# Patient Record
Sex: Female | Born: 1995 | Race: Black or African American | Hispanic: No | Marital: Single | Smoking: Never smoker
Health system: Southern US, Community
[De-identification: ages and names within clinical notes are randomized; demographics above are authoritative.]

## PROBLEM LIST (undated history)

## (undated) DIAGNOSIS — F32A Depression, unspecified: Secondary | ICD-10-CM

## (undated) DIAGNOSIS — T7840XA Allergy, unspecified, initial encounter: Secondary | ICD-10-CM

## (undated) DIAGNOSIS — A5901 Trichomonal vulvovaginitis: Secondary | ICD-10-CM

## (undated) DIAGNOSIS — I10 Essential (primary) hypertension: Secondary | ICD-10-CM

## (undated) DIAGNOSIS — J45909 Unspecified asthma, uncomplicated: Secondary | ICD-10-CM

## (undated) DIAGNOSIS — K219 Gastro-esophageal reflux disease without esophagitis: Secondary | ICD-10-CM

## (undated) DIAGNOSIS — F909 Attention-deficit hyperactivity disorder, unspecified type: Secondary | ICD-10-CM

## (undated) DIAGNOSIS — A749 Chlamydial infection, unspecified: Secondary | ICD-10-CM

## (undated) HISTORY — DX: Unspecified asthma, uncomplicated: J45.909

## (undated) HISTORY — DX: Attention-deficit hyperactivity disorder, unspecified type: F90.9

## (undated) HISTORY — DX: Essential (primary) hypertension: I10

## (undated) HISTORY — DX: Trichomonal vulvovaginitis: A59.01

## (undated) HISTORY — DX: Allergy, unspecified, initial encounter: T78.40XA

## (undated) HISTORY — DX: Depression, unspecified: F32.A

## (undated) HISTORY — DX: Chlamydial infection, unspecified: A74.9

---
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
## 2016-08-31 ENCOUNTER — Emergency Department (HOSPITAL_COMMUNITY)
Admission: EM | Admit: 2016-08-31 | Discharge: 2016-08-31 | Disposition: A | Discharge status: Home / Self Care | Payer: Medicaid Other | Attending: Emergency Medicine | Admitting: Emergency Medicine

## 2016-08-31 ENCOUNTER — Encounter (HOSPITAL_COMMUNITY): Payer: Self-pay | Admitting: *Deleted

## 2016-08-31 ENCOUNTER — Emergency Department (HOSPITAL_COMMUNITY): Payer: Medicaid Other

## 2016-08-31 DIAGNOSIS — M545 Low back pain: Secondary | ICD-10-CM

## 2016-08-31 DIAGNOSIS — R112 Nausea with vomiting, unspecified: Secondary | ICD-10-CM

## 2016-08-31 DIAGNOSIS — F909 Attention-deficit hyperactivity disorder, unspecified type: Secondary | ICD-10-CM | POA: Diagnosis not present

## 2016-08-31 DIAGNOSIS — R109 Unspecified abdominal pain: Secondary | ICD-10-CM | POA: Diagnosis not present

## 2016-08-31 LAB — I-STAT BETA HCG BLOOD, ED (MC, WL, AP ONLY): I-stat hCG, quantitative: <5 m[IU]/mL (ref <5)

## 2016-08-31 LAB — URINE MICROSCOPIC-ADD ON

## 2016-08-31 LAB — COMPREHENSIVE METABOLIC PANEL
ALT: 23 U/L (ref 14–54)
ANION GAP: 6 (ref 5–15)
AST: 37 U/L (ref 15–41)
Albumin: 3.4 g/dL — ABNORMAL LOW (ref 3.5–5.0)
Alkaline Phosphatase: 100 U/L (ref 38–126)
BILIRUBIN TOTAL: 0.2 mg/dL — AB (ref 0.3–1.2)
BUN: 8 mg/dL (ref 6–20)
CHLORIDE: 107 mmol/L (ref 101–111)
CO2: 23 mmol/L (ref 22–32)
Calcium: 8.7 mg/dL — ABNORMAL LOW (ref 8.9–10.3)
Creatinine, Ser: 0.6 mg/dL (ref 0.44–1.00)
Glucose, Bld: 91 mg/dL (ref 65–99)
POTASSIUM: 4.7 mmol/L (ref 3.5–5.1)
Sodium: 136 mmol/L (ref 135–145)
TOTAL PROTEIN: 7.3 g/dL (ref 6.5–8.1)

## 2016-08-31 LAB — CBC
HEMATOCRIT: 35.7 % — AB (ref 36.0–46.0)
HEMOGLOBIN: 10.8 g/dL — AB (ref 12.0–15.0)
MCH: 22.5 pg — ABNORMAL LOW (ref 26.0–34.0)
MCHC: 30.3 g/dL (ref 30.0–36.0)
MCV: 74.2 fL — AB (ref 78.0–100.0)
Platelets: 407 10*3/uL — ABNORMAL HIGH (ref 150–400)
RBC: 4.81 MIL/uL (ref 3.87–5.11)
RDW: 16.1 % — AB (ref 11.5–15.5)
WBC: 9.3 10*3/uL (ref 4.0–10.5)

## 2016-08-31 LAB — URINALYSIS, ROUTINE W REFLEX MICROSCOPIC
Bilirubin Urine: NEGATIVE
GLUCOSE, UA: NEGATIVE mg/dL
KETONES UR: NEGATIVE mg/dL
NITRITE: NEGATIVE
PH: 7 (ref 5.0–8.0)
Protein, ur: NEGATIVE mg/dL
Specific Gravity, Urine: 1.02 (ref 1.005–1.030)

## 2016-08-31 LAB — LIPASE, BLOOD: LIPASE: 18 U/L (ref 11–51)

## 2016-08-31 MED ORDER — PROMETHAZINE HCL 25 MG/ML IJ SOLN
12.5000 mg | Freq: Once | INTRAMUSCULAR | Status: AC
  Administered 2016-08-31: 12.5 mg via INTRAVENOUS
  Filled 2016-08-31: qty 1

## 2016-08-31 MED ORDER — CEPHALEXIN 500 MG PO CAPS: ORAL_CAPSULE | ORAL | 0 refills | Status: DC

## 2016-08-31 MED ORDER — PROMETHAZINE HCL 25 MG PO TABS: 25.0000 mg | ORAL_TABLET | Freq: Four times a day (QID) | ORAL | 0 refills | Status: DC | PRN

## 2016-08-31 MED ORDER — KETOROLAC TROMETHAMINE 30 MG/ML IJ SOLN
30.0000 mg | Freq: Once | INTRAMUSCULAR | Status: AC
  Administered 2016-08-31: 30 mg via INTRAVENOUS
  Filled 2016-08-31: qty 1

## 2016-08-31 MED ORDER — TRAMADOL HCL 50 MG PO TABS: 50.0000 mg | ORAL_TABLET | Freq: Four times a day (QID) | ORAL | 0 refills | Status: DC | PRN

## 2016-08-31 MED ORDER — ONDANSETRON HCL 4 MG/2ML IJ SOLN
4.0000 mg | Freq: Once | INTRAMUSCULAR | Status: AC
  Administered 2016-08-31: 4 mg via INTRAVENOUS
  Filled 2016-08-31: qty 2

## 2016-08-31 MED ORDER — HYDROMORPHONE HCL 2 MG/ML IJ SOLN
1.0000 mg | Freq: Once | INTRAMUSCULAR | Status: AC
  Administered 2016-08-31: 1 mg via INTRAVENOUS
  Filled 2016-08-31: qty 1

## 2016-08-31 MED ORDER — IOPAMIDOL (ISOVUE-300) INJECTION 61%
INTRAVENOUS | Status: AC
  Administered 2016-08-31: 100 mL
  Filled 2016-08-31: qty 100

## 2016-08-31 MED ORDER — SODIUM CHLORIDE 0.9 % IV SOLN
1000.0000 mL | Freq: Once | INTRAVENOUS | Status: AC
  Administered 2016-08-31: 1000 mL via INTRAVENOUS

## 2016-08-31 MED ORDER — SODIUM CHLORIDE 0.9 % IV SOLN
1000.0000 mL | INTRAVENOUS | Status: DC
  Administered 2016-08-31 (×3): 1000 mL via INTRAVENOUS

## 2016-08-31 MED ORDER — HYDROCODONE-ACETAMINOPHEN 5-325 MG PO TABS: 1.0000 | ORAL_TABLET | ORAL | 0 refills | Status: DC | PRN

## 2016-08-31 MED ORDER — CEFTRIAXONE SODIUM 1 G IJ SOLR
1.0000 g | Freq: Once | INTRAMUSCULAR | Status: AC
  Administered 2016-08-31: 1 g via INTRAVENOUS
  Filled 2016-08-31: qty 10

## 2016-08-31 MED ORDER — SODIUM CHLORIDE 0.9 % IV BOLUS (SEPSIS)
1000.0000 mL | Freq: Once | INTRAVENOUS | Status: AC
  Administered 2016-08-31: 1000 mL via INTRAVENOUS

## 2016-08-31 NOTE — ED Provider Notes (Signed)
[facility]-EMERGENCY DEPT Provider Note   CSN: [id number] Arrival date & time: 08/31/16  1101     History   Chief Complaint Chief Complaint  Patient presents with  . Emesis  . Back Pain    HPI Brandy [name] is a 20 y.o. female   Patient is a 20 year old female with history of cholecystectomy presented with worsening bilateral flank pain 1 week. She reports 3 episodes of vomiting within the last week and loss of appetite. Eating anything today and trying the yesterday with little success. She characterizes her flank pain as pressure with right greater than left and 8/10. She admits to having chills. She denies urinary symptoms, chest pain, shortness of breath, hematuria, hematemesis, bloody stools, or any recent sexual activity.    The history is provided by the patient.  Emesis   Associated symptoms include chills, cough, diarrhea and headaches. Pertinent negatives include no arthralgias and no fever.  Back Pain   Associated symptoms include headaches. Pertinent negatives include no chest pain, no fever and no dysuria.    Past Medical History:  Diagnosis Date  . Abdominal pain, recurrent   . ADHD (attention deficit hyperactivity disorder)   . Constipation   . GERD (gastroesophageal reflux disease)   . Nausea     Patient Active Problem List   Diagnosis Date Noted  . Cholecystitis with cholelithiasis 11/16/2015  . Constipation   . Nausea   . Abdominal pain, recurrent     Past Surgical History:  Procedure Laterality Date  . CHOLECYSTECTOMY N/A 11/17/2015   Procedure: LAPAROSCOPIC CHOLECYSTECTOMY ATTEMPTED INTRAOPERATIVE CHOLANGIOGRAM ;  Surgeon: Harriette Bouillon, MD;  Location: [facility] OR;  Service: General;  Laterality: N/A;  . NO PAST SURGERIES      OB History    Gravida Para Term Preterm AB Living   0             SAB TAB Ectopic Multiple Live Births                   Home Medications    Prior to Admission medications   Medication Sig Start Date End Date Taking?  Authorizing Provider  acetaminophen (TYLENOL) 325 MG tablet Take 650 mg by mouth every 6 (six) hours as needed for moderate pain or headache.   Yes Historical Provider, MD  ibuprofen (ADVIL,MOTRIN) 200 MG tablet Take 400 mg by mouth every 6 (six) hours as needed for headache or moderate pain.   Yes Historical Provider, MD  HYDROcodone-acetaminophen (NORCO/VICODIN) 5-325 MG tablet Take 1-2 tablets by mouth every 4 (four) hours as needed for moderate pain. Patient not taking: Reported on 08/31/2016 11/18/15   Claud Kelp, MD  norgestimate-ethinyl estradiol (ORTHO-CYCLEN, 28,) 0.25-35 MG-MCG tablet Take 1 tablet by mouth daily. Patient not taking: Reported on 08/31/2016 11/01/13   Delbert Phenix, NP    Family History History reviewed. No pertinent family history.  Social History Social History  Substance Use Topics  . Smoking status: Never Smoker  . Smokeless tobacco: Never Used  . Alcohol use No     Allergies   Patient has no known allergies.   Review of Systems Review of Systems  Constitutional: Positive for appetite change and chills. Negative for fever.  HENT: Negative for ear pain and sore throat.   Eyes: Negative for pain and visual disturbance.  Respiratory: Positive for cough. Negative for shortness of breath.   Cardiovascular: Negative for chest pain and palpitations.  Gastrointestinal: Positive for diarrhea and vomiting. Negative for blood in  stool.  Genitourinary: Positive for flank pain (Bilaterally). Negative for difficulty urinating, dysuria and hematuria.  Musculoskeletal: Negative for arthralgias and back pain.  Skin: Negative for color change and rash.  Neurological: Positive for headaches. Negative for seizures and syncope.     Physical Exam Updated Vital Signs BP 128/74   Pulse 116   Temp 100 F (37.8 C) (Oral)   Resp 20   LMP 08/16/2016   SpO2 100%   Physical Exam  Constitutional: She is oriented to person, place, and time. She appears  well-developed and well-nourished.  HENT:  Head: Normocephalic and atraumatic.  Nose: Nose normal.  Mouth/Throat: Oropharynx is clear and moist.  Eyes: Conjunctivae and EOM are normal. Pupils are equal, round, and reactive to light.  Neck: Normal range of motion. Neck supple.  Cardiovascular: Normal rate and normal heart sounds.   Pulmonary/Chest: Effort normal and breath sounds normal. No respiratory distress.  Abdominal: Soft. There is tenderness. There is CVA tenderness (Bilateral). There is no rebound, no guarding and negative Murphy's sign.  Genitourinary: No vaginal discharge found.  Musculoskeletal: Normal range of motion.  Neurological: She is alert and oriented to person, place, and time.  Skin: Skin is warm.  Psychiatric: She has a normal mood and affect. Her behavior is normal.  Nursing note and vitals reviewed.    ED Treatments / Results  Labs (all labs ordered are listed, but only abnormal results are displayed) Labs Reviewed  COMPREHENSIVE METABOLIC PANEL - Abnormal; Notable for the following:       Result Value   Calcium 8.7 (*)    Albumin 3.4 (*)    Total Bilirubin 0.2 (*)    All other components within normal limits  CBC - Abnormal; Notable for the following:    Hemoglobin 10.8 (*)    HCT 35.7 (*)    MCV 74.2 (*)    MCH 22.5 (*)    RDW 16.1 (*)    Platelets 407 (*)    All other components within normal limits  URINALYSIS, ROUTINE W REFLEX MICROSCOPIC (NOT AT [facility]) - Abnormal; Notable for the following:    Hgb urine dipstick MODERATE (*)    Leukocytes, UA TRACE (*)    All other components within normal limits  URINE MICROSCOPIC-ADD ON - Abnormal; Notable for the following:    Squamous Epithelial / LPF 6-30 (*)    Bacteria, UA FEW (*)    All other components within normal limits  LIPASE, BLOOD  I-STAT BETA HCG BLOOD, ED ([facility], [facility], [facility] ONLY)    EKG  EKG Interpretation None       Radiology Ct Abdomen Pelvis W Contrast  Result Date:  08/31/2016 CLINICAL DATA:  Nausea, vomiting, and diarrhea with bilateral pain. EXAM: CT ABDOMEN AND PELVIS WITH CONTRAST TECHNIQUE: Multidetector CT imaging of the abdomen and pelvis was performed using the standard protocol following bolus administration of intravenous contrast. CONTRAST:  100mL ISOVUE-300 IOPAMIDOL (ISOVUE-300) INJECTION 61% COMPARISON:  None. FINDINGS: Lower chest: No acute abnormality. Hepatobiliary: Hepatic steatosis. Cholecystectomy. No other abnormalities. Pancreas: Unremarkable. No pancreatic ductal dilatation or surrounding inflammatory changes. Spleen: Normal in size without focal abnormality. Adrenals/Urinary Tract: Adrenal glands are unremarkable. Kidneys are normal, without renal calculi, focal lesion, or hydronephrosis. Bladder is unremarkable. Stomach/Bowel: The stomach and small bowel are normal. The colon is normal. The cecum cysts in the right pelvis abutting the right ovary. Most of the appendix is completely normal in appearance, air-filled and thin walled. The distal appendix does contain fluid. However, it  is thin walled and not particularly dilated measuring 6 mm in diameter. There is no adjacent stranding. Vascular/Lymphatic: The abdominal aorta and its branching vessels are normal in appearance. A few prominent nodes are seen in the right lower lobe mesenteries measuring up to 11 mm on series 201, image 60. No other adenopathy. Reproductive: Uterus and ovaries are unremarkable. Other: No abdominal wall hernia or abnormality. No abdominopelvic ascites. Musculoskeletal: No acute or significant osseous findings. IMPRESSION: 1. The distal appendix is fluid-filled and borderline in caliber. However, it is thin walled with no adjacent stranding. No convincing evidence of appendicitis. If there is high concern for appendicitis, a short-term follow-up could be obtained. 2. Mild prominent nodes in the right lower quadrant mesentery may be reactive representing adenitis. 3. No other  significant abnormalities. Electronically Signed   By: Gerome Samavid  Williams III M.D   On: 08/31/2016 16:06    Procedures Procedures (including critical care time)  Medications Ordered in ED Medications  0.9 %  sodium chloride infusion (0 mLs Intravenous Stopped 08/31/16 1611)    Followed by  0.9 %  sodium chloride infusion (1,000 mLs Intravenous New Bag/Given 08/31/16 1724)  cefTRIAXone (ROCEPHIN) 1 g in dextrose 5 % 50 mL IVPB (1 g Intravenous New Bag/Given 08/31/16 1725)  ondansetron (ZOFRAN) injection 4 mg (4 mg Intravenous Given 08/31/16 1400)  HYDROmorphone (DILAUDID) injection 1 mg (1 mg Intravenous Given 08/31/16 1403)  iopamidol (ISOVUE-300) 61 % injection (100 mLs  Contrast Given 08/31/16 1513)  HYDROmorphone (DILAUDID) injection 1 mg (1 mg Intravenous Given 08/31/16 1546)  ketorolac (TORADOL) 30 MG/ML injection 30 mg (30 mg Intravenous Given 08/31/16 1722)  sodium chloride 0.9 % bolus 1,000 mL (1,000 mLs Intravenous New Bag/Given 08/31/16 1630)  ondansetron (ZOFRAN) injection 4 mg (4 mg Intravenous Given 08/31/16 1729)     Initial Impression / Assessment and Plan / ED Course  I have reviewed the triage vital signs and the nursing notes.  Pertinent labs & imaging results that were available during my care of the patient were reviewed by me and considered in my medical decision making (see chart for details).  Clinical Course   Patient also seen by attending physician. Patient is a 20 year old with history of cholecystectomy. On exam patient is febrile and tachycardic with normal respirations and 97% O2 at room air. patient's heart and lung sounds clear, TTP to bilateral flanks. Otherwise abdomen was soft. No obvious Murphy sign or focal tenderness at McBurney's point. Patient given fluids, pain medication, and Zofran. Patient's Hemoglobin and Hematocrit is low but consistent with previous findings and is most likely her baseline. Blood work and urinalysis do not show signs of  infection.  13:20 upon reassessment patient feels better, noting 4/10 pain. Will order abdominal of this CT with contrast.   CT results shows no convincing evidence of appendicitis. Right lower quadrant mesentery may be reactive adenitis. Patient's history and repeat physical show low suspicion for appendicitis. Patient's history and physical is more consistent with UTI with pyelonephritis, even with no obvious signs of infection on labs. Will be given antibiotics and pain medication and observe vital signs.  Patient care will be transferred to Dr. Clayborne DanaMesner who will assess and treat accordingly.   Final Clinical Impressions(s) / ED Diagnoses   Final diagnoses:  None    New Prescriptions New Prescriptions   No medications on file     [address] [place], [place] 08/31/16 1737    Vanetta MuldersScott Zackowski, MD 09/04/16 [phone]

## 2016-08-31 NOTE — ED Notes (Signed)
Pt had episode of emesis after ambulating to the bathroom.

## 2016-08-31 NOTE — ED Notes (Signed)
Pt. Return from CT scan,

## 2016-08-31 NOTE — ED Provider Notes (Signed)
8:16 PM Assumed care from Dr. Deretha EmoryZackowski, please see their note for full history, physical and decision making until this point. In brief this is a 20 y.o. year old female who presented to the ED tonight with Emesis and Back Pain     Patient here with emesis and back pain. Was awaiting CT scan and it showed a little bit of fluid and the appendix but evidence of appendicitis. Patient was suprapubic pain rather than right lower quadrant pain. On reevaluation her urine appears to have leukocytes and bacteria but no nitrites. Since she's had some polyuria with this I will treat her for possible pyelonephritis and UTI. Patient's vomiting improved with Phenergan. Plan was to discharge with Norco however she has an intolerance to that (and mother requested percocet) apparently so a prescription for Ultram was given. Her mother got upset about this and thought that I was not giving her daughter Percocet because I thought that the mother was going to take them. I attempted to reassure this was not the case however mother was still upset. Patient will return here for recheck in 24-48 hours if symptoms aren't improving otherwise will follow up with primary doctor in a few days to make sure that urine is clear.  Discharge instructions, including strict return precautions for new or worsening symptoms, given. Patient and/or family verbalized understanding and agreement with the plan as described.   Labs, studies and imaging reviewed by myself and considered in medical decision making if ordered. Imaging interpreted by radiology.  Labs Reviewed  COMPREHENSIVE METABOLIC PANEL - Abnormal; Notable for the following:       Result Value   Calcium 8.7 (*)    Albumin 3.4 (*)    Total Bilirubin 0.2 (*)    All other components within normal limits  CBC - Abnormal; Notable for the following:    Hemoglobin 10.8 (*)    HCT 35.7 (*)    MCV 74.2 (*)    MCH 22.5 (*)    RDW 16.1 (*)    Platelets 407 (*)    All other  components within normal limits  URINALYSIS, ROUTINE W REFLEX MICROSCOPIC (NOT AT [facility]) - Abnormal; Notable for the following:    Hgb urine dipstick MODERATE (*)    Leukocytes, UA TRACE (*)    All other components within normal limits  URINE MICROSCOPIC-ADD ON - Abnormal; Notable for the following:    Squamous Epithelial / LPF 6-30 (*)    Bacteria, UA FEW (*)    All other components within normal limits  LIPASE, BLOOD  I-STAT BETA HCG BLOOD, ED ([facility], [facility], [facility] ONLY)    CT Abdomen Pelvis W Contrast  Final Result      No Follow-up on file.    Marily MemosJason Luismanuel Corman, MD 09/01/16 2019

## 2016-08-31 NOTE — ED Notes (Signed)
Reported to Dr. Myrtis SerKatz that pt. Is nauseated.

## 2016-08-31 NOTE — ED Notes (Signed)
Pt. oob to the bathroom, gait steady.  Pt. Continues to be nauseated.  Will inform the MD at this time

## 2016-08-31 NOTE — ED Notes (Signed)
MD Mesner at bedside  °

## 2016-08-31 NOTE — ED Provider Notes (Signed)
Medical screening examination/treatment/procedure(s) were conducted as a shared visit with non-physician practitioner(s) and myself.  I personally evaluated the patient during the encounter.   EKG Interpretation None       Results for orders placed or performed during the hospital encounter of 08/31/16  Lipase, blood  Result Value Ref Range   Lipase 18 11 - 51 U/L  Comprehensive metabolic panel  Result Value Ref Range   Sodium 136 135 - 145 mmol/L   Potassium 4.7 3.5 - 5.1 mmol/L   Chloride 107 101 - 111 mmol/L   CO2 23 22 - 32 mmol/L   Glucose, Bld 91 65 - 99 mg/dL   BUN 8 6 - 20 mg/dL   Creatinine, Ser 4.090.60 0.44 - 1.00 mg/dL   Calcium 8.7 (L) 8.9 - 10.3 mg/dL   Total Protein 7.3 6.5 - 8.1 g/dL   Albumin 3.4 (L) 3.5 - 5.0 g/dL   AST 37 15 - 41 U/L   ALT 23 14 - 54 U/L   Alkaline Phosphatase 100 38 - 126 U/L   Total Bilirubin 0.2 (L) 0.3 - 1.2 mg/dL   GFR calc non Af Amer >60 >60 mL/min   GFR calc Af Amer >60 >60 mL/min   Anion gap 6 5 - 15  CBC  Result Value Ref Range   WBC 9.3 4.0 - 10.5 K/uL   RBC 4.81 3.87 - 5.11 MIL/uL   Hemoglobin 10.8 (L) 12.0 - 15.0 g/dL   HCT 81.135.7 (L) 91.436.0 - 78.246.0 %   MCV 74.2 (L) 78.0 - 100.0 fL   MCH 22.5 (L) 26.0 - 34.0 pg   MCHC 30.3 30.0 - 36.0 g/dL   RDW 95.616.1 (H) 21.311.5 - 08.615.5 %   Platelets 407 (H) 150 - 400 K/uL  Urinalysis, Routine w reflex microscopic  Result Value Ref Range   Color, Urine YELLOW YELLOW   APPearance CLEAR CLEAR   Specific Gravity, Urine 1.020 1.005 - 1.030   pH 7.0 5.0 - 8.0   Glucose, UA NEGATIVE NEGATIVE mg/dL   Hgb urine dipstick MODERATE (A) NEGATIVE   Bilirubin Urine NEGATIVE NEGATIVE   Ketones, ur NEGATIVE NEGATIVE mg/dL   Protein, ur NEGATIVE NEGATIVE mg/dL   Nitrite NEGATIVE NEGATIVE   Leukocytes, UA TRACE (A) NEGATIVE  Urine microscopic-add on  Result Value Ref Range   Squamous Epithelial / LPF 6-30 (A) NONE SEEN   WBC, UA 0-5 0 - 5 WBC/hpf   RBC / HPF 6-30 0 - 5 RBC/hpf   Bacteria, UA FEW (A) NONE  SEEN  I-Stat beta hCG blood, ED  Result Value Ref Range   I-stat hCG, quantitative <5.0 <5 mIU/mL   Comment 3           No results found.  Patient seen by me along with physician assistant. Patient with 2 day complaint of bilateral flank pain. Patient's had her gallbladder removed in the past. Patient with questionable dysuria. However urinalysis is negative. Patient's had a recent upper respiratory viral type illness with some coughing. Possible that the flank pain could be muscle strain. Does hurt more with movement. No falls or injuries. Labs without significant findings. HCG negative. Liver function tests lipase normal. No leukocytosis.  Patient also has concern about polycystic ovarian disease. Because of the past the surgery and having the nausea and vomiting. Will go ahead and do CT abdomen pelvis if negative patient can be treated for muscular skeletal back pain.  Patient's abdomen is soft and nontender lungs are clear bilaterally. Patient's  alert and oriented. Heart regular rate and rhythm tachycardia no murmurs. Temperature 100F. Presenting pulse was around 110. Fluoroscopy due to the pain. Patient's pain treated here. Options saturations 97%. On room air.   Vanetta MuldersScott Shayleen Eppinger, MD 08/31/16 1450

## 2016-08-31 NOTE — ED Notes (Signed)
Pt verbalized understanding of d/c instructions and has no further questions. Pt is stable, A&Ox4, VSS.  

## 2016-08-31 NOTE — ED Notes (Signed)
Pt mom concerned about Vicodin script b/c it makes her sick. Doctor prescribed tramadol. Pt's mother became upset b/c she feels like it will not control her pain. Doctor went to talk to pt and mother. Pt's mother became more upset b/c MD would not prescribe percocet.

## 2016-08-31 NOTE — ED Notes (Signed)
Attempted lab draw left ac with no success

## 2016-08-31 NOTE — ED Triage Notes (Signed)
Pt reports lower back pain and n/v/d x 1 week. Denies urinary symptoms.

## 2016-09-02 ENCOUNTER — Encounter: Payer: Self-pay | Admitting: *Deleted

## 2016-09-11 ENCOUNTER — Ambulatory Visit (INDEPENDENT_AMBULATORY_CARE_PROVIDER_SITE_OTHER): Payer: Medicaid Other | Admitting: Family Medicine

## 2016-09-11 ENCOUNTER — Encounter: Payer: Self-pay | Admitting: Family Medicine

## 2016-09-11 VITALS — BP 134/83 | HR 89 | Resp 20 | Ht 65.0 in | Wt 268.0 lb

## 2016-09-11 DIAGNOSIS — N926 Irregular menstruation, unspecified: Secondary | ICD-10-CM | POA: Diagnosis not present

## 2016-09-11 LAB — TSH: TSH: 0.59 m[IU]/L (ref 0.50–4.30)

## 2016-09-11 NOTE — Progress Notes (Signed)
CLINIC ENCOUNTER NOTE  History:  20 y.o. G0P0 here today for irregular periods. Menses q4 months, with heavy periods, diarrhea and HA associated with periods.  Reports she does have upper lip hair. Was on OCP about 1 year ago but it "made her gain 15 lbs" and she stopped it. When she was on the OCP she had regular periods.   Family hx s/f T2DM  She denies any abnormal vaginal discharge, bleeding, pelvic pain or other concerns.    Past Medical History:  Diagnosis Date  . Abdominal pain, recurrent   . ADHD (attention deficit hyperactivity disorder)   . Constipation   . GERD (gastroesophageal reflux disease)   . Nausea     Past Surgical History:  Procedure Laterality Date  . CHOLECYSTECTOMY N/A 11/17/2015   Procedure: LAPAROSCOPIC CHOLECYSTECTOMY ATTEMPTED INTRAOPERATIVE CHOLANGIOGRAM ;  Surgeon: Harriette Bouillonhomas Cornett, MD;  Location: [facility] OR;  Service: General;  Laterality: N/A;  . NO PAST SURGERIES      The following portions of the patient's history were reviewed and updated as appropriate: allergies, current medications, past family history, past medical history, past social history, past surgical history and problem list.   Health Maintenance: Pap due @21   Review of Systems:  Pertinent items noted in HPI and remainder of comprehensive ROS otherwise negative.   Objective:  Physical Exam BP 134/83 (BP Location: Left Arm, Patient Position: Sitting, Cuff Size: Large)   Pulse 89   Resp 20   Ht 5\' 5"  (1.651 m)   Wt 268 lb (121.6 kg)   LMP 08/16/2016   BMI 44.60 kg/m  CONSTITUTIONAL: Well-developed, well-nourished female in no acute distress.  HENT:  Normocephalic, atraumatic.  Oropharynx is clear and moist EYES: Conjunctivae and EOM are normal.  NECK: Normal range of motion, supple, no masses. Acanthosis nigricans present SKIN: Skin is warm and dry. No rash noted. Not diaphoretic. No erythema. No pallor. NEUROLGIC: Alert and oriented to person, place, and time. Normal reflexes,  muscle tone coordination. No cranial nerve deficit noted. PSYCHIATRIC: Normal mood and affect. Normal behavior. Normal judgment and thought content. CARDIOVASCULAR: Normal heart rate noted RESPIRATORY: Effort and breath sounds normal, no problems with respiration noted ABDOMEN: Soft, no distention noted.   PELVIC: Deferred MUSCULOSKELETAL: Normal range of motion. No edema noted.  Labs and Imaging Ct Abdomen Pelvis W Contrast  Result Date: 08/31/2016 CLINICAL DATA:  Nausea, vomiting, and diarrhea with bilateral pain. EXAM: CT ABDOMEN AND PELVIS WITH CONTRAST TECHNIQUE: Multidetector CT imaging of the abdomen and pelvis was performed using the standard protocol following bolus administration of intravenous contrast. CONTRAST:  100mL ISOVUE-300 IOPAMIDOL (ISOVUE-300) INJECTION 61% COMPARISON:  None. FINDINGS: Lower chest: No acute abnormality. Hepatobiliary: Hepatic steatosis. Cholecystectomy. No other abnormalities. Pancreas: Unremarkable. No pancreatic ductal dilatation or surrounding inflammatory changes. Spleen: Normal in size without focal abnormality. Adrenals/Urinary Tract: Adrenal glands are unremarkable. Kidneys are normal, without renal calculi, focal lesion, or hydronephrosis. Bladder is unremarkable. Stomach/Bowel: The stomach and small bowel are normal. The colon is normal. The cecum cysts in the right pelvis abutting the right ovary. Most of the appendix is completely normal in appearance, air-filled and thin walled. The distal appendix does contain fluid. However, it is thin walled and not particularly dilated measuring 6 mm in diameter. There is no adjacent stranding. Vascular/Lymphatic: The abdominal aorta and its branching vessels are normal in appearance. A few prominent nodes are seen in the right lower lobe mesenteries measuring up to 11 mm on series 201, image 60. No other adenopathy.  Reproductive: Uterus and ovaries are unremarkable. Other: No abdominal wall hernia or abnormality. No  abdominopelvic ascites. Musculoskeletal: No acute or significant osseous findings. IMPRESSION: 1. The distal appendix is fluid-filled and borderline in caliber. However, it is thin walled with no adjacent stranding. No convincing evidence of appendicitis. If there is high concern for appendicitis, a short-term follow-up could be obtained. 2. Mild prominent nodes in the right lower quadrant mesentery may be reactive representing adenitis. 3. No other significant abnormalities. Electronically Signed   By: Gerome Samavid  Williams III M.D   On: 08/31/2016 16:06    Assessment & Plan:   1. Irregular periods - ddx include metabolic syndrome vs PCOS. I suspect an aspect of insulin resistance given physical exam findings and this was discussed with the patient.  - TSH - Hemoglobin A1c - If HA1C > 5.7%, start metformin 500 BID for insulin resistance/pre-diabetes. Patient should increase to 100 BID - If TSH elevated will need T4/T3 labs - If testing negative, recommend pelvic US to assess ovarian status.  2. Obesity, morbid, BMI 40.0-49.9 (HCC) -Stressed physical activity, currently walking 2 times per week - Reviewed role of obesity in PCOS and insulin resistance.   Routine preventative health maintenance measures emphasized. Please refer to After Visit Summary for other counseling recommendations.   Return in about 6 weeks (around 10/23/2016) for irregular periods.  Future Appointments Date Time Provider Department Center  10/23/2016 2:45 PM Federico FlakeKimberly Niles Jailah Willis, MD [facility]   Total face-to-face time with patient: 30 minutes. Over 50% of encounter was spent on counseling and coordination of care.

## 2016-09-12 LAB — HEMOGLOBIN A1C
HEMOGLOBIN A1C: 5.5 % (ref <5.7)
MEAN PLASMA GLUCOSE: 111 mg/dL

## 2016-09-16 ENCOUNTER — Other Ambulatory Visit: Payer: Self-pay | Admitting: Family Medicine

## 2016-09-16 DIAGNOSIS — N926 Irregular menstruation, unspecified: Secondary | ICD-10-CM

## 2016-09-16 NOTE — Progress Notes (Signed)
Ordering pelvic US-- Patient with risk factors for PCOS.

## 2016-09-19 ENCOUNTER — Telehealth: Payer: Self-pay

## 2016-09-19 NOTE — Telephone Encounter (Signed)
Called pt to inform her of a pelvic/trans vag u/s appt we got scheduled for her. Spoke w/ patient. Pt understands appt date/time/location

## 2016-10-04 ENCOUNTER — Ambulatory Visit (HOSPITAL_COMMUNITY)
Admission: RE | Admit: 2016-10-04 | Discharge: 2016-10-04 | Disposition: A | Discharge status: Home / Self Care | Payer: Medicaid Other | Source: Ambulatory Visit | Attending: Family Medicine | Admitting: Family Medicine

## 2016-10-04 DIAGNOSIS — N926 Irregular menstruation, unspecified: Secondary | ICD-10-CM | POA: Diagnosis present

## 2016-10-07 ENCOUNTER — Encounter (HOSPITAL_COMMUNITY): Payer: Self-pay | Admitting: Emergency Medicine

## 2016-10-07 DIAGNOSIS — Y92410 Unspecified street and highway as the place of occurrence of the external cause: Secondary | ICD-10-CM | POA: Insufficient documentation

## 2016-10-07 DIAGNOSIS — F909 Attention-deficit hyperactivity disorder, unspecified type: Secondary | ICD-10-CM | POA: Insufficient documentation

## 2016-10-07 DIAGNOSIS — Y999 Unspecified external cause status: Secondary | ICD-10-CM | POA: Diagnosis not present

## 2016-10-07 DIAGNOSIS — S299XXA Unspecified injury of thorax, initial encounter: Secondary | ICD-10-CM | POA: Diagnosis present

## 2016-10-07 DIAGNOSIS — Y939 Activity, unspecified: Secondary | ICD-10-CM | POA: Insufficient documentation
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
## 2016-10-08 ENCOUNTER — Emergency Department (HOSPITAL_COMMUNITY): Payer: Medicaid Other

## 2016-10-08 ENCOUNTER — Emergency Department (HOSPITAL_COMMUNITY)
Admission: EM | Admit: 2016-10-08 | Discharge: 2016-10-08 | Disposition: A | Discharge status: Home / Self Care | Payer: Medicaid Other | Attending: Emergency Medicine | Admitting: Emergency Medicine

## 2016-10-08 NOTE — ED Notes (Signed)
Pt transported to xray 

## 2016-10-08 NOTE — ED Notes (Signed)
Pt verbalized understanding of d/c instructions and has no further questions. Pt stable and NAD. Pt d/c home with friend. Pt ambulatory
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
## 2016-10-23 ENCOUNTER — Ambulatory Visit (INDEPENDENT_AMBULATORY_CARE_PROVIDER_SITE_OTHER): Payer: Medicaid Other | Admitting: Family Medicine

## 2016-10-23 ENCOUNTER — Encounter: Payer: Self-pay | Admitting: Family Medicine

## 2016-10-23 VITALS — BP 136/84 | HR 82 | Ht 65.0 in | Wt 264.0 lb

## 2016-10-23 DIAGNOSIS — N926 Irregular menstruation, unspecified: Secondary | ICD-10-CM | POA: Diagnosis not present

## 2016-10-23 NOTE — Progress Notes (Signed)
F/U US - completed 10/04/16

## 2016-10-23 NOTE — Patient Instructions (Signed)
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
## 2017-06-04 IMAGING — US US TRANSVAGINAL NON-OB
1 series · 15 of 25 positions shown · non-contrast
Comparison: CT abdomen pelvis 08/31/2016

CLINICAL DATA: Irregular menses, question polycystic ovarian
syndrome

EXAM:
TRANSABDOMINAL AND TRANSVAGINAL ULTRASOUND OF PELVIS
TECHNIQUE: Both transabdominal and transvaginal ultrasound examinations of the
pelvis were performed. Transabdominal technique was performed for
global imaging of the pelvis including uterus, ovaries, adnexal
regions, and pelvic cul-de-sac. It was necessary to proceed with
endovaginal exam following the transabdominal exam to visualize the
endometrium and ovaries.

[Series 1: us transvaginal non-ob · 15 of 67 slices shown]
[im 1/67]
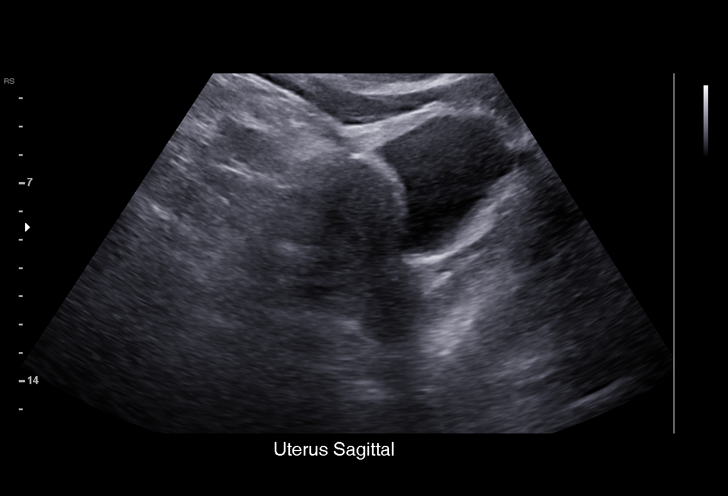
[im 6/67]
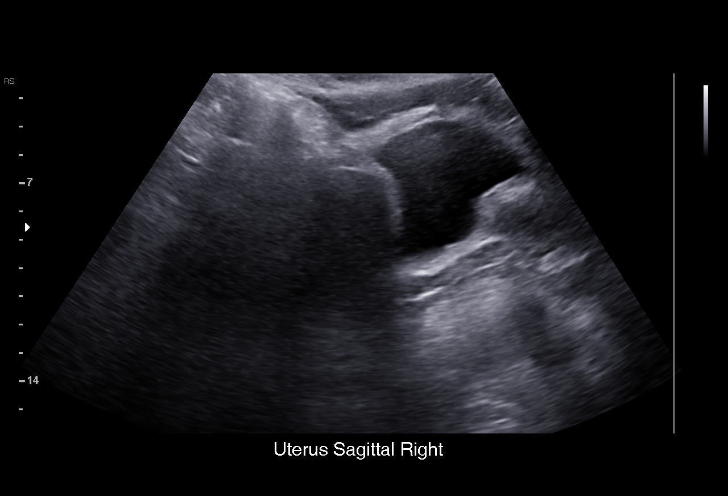
[im 12/67]
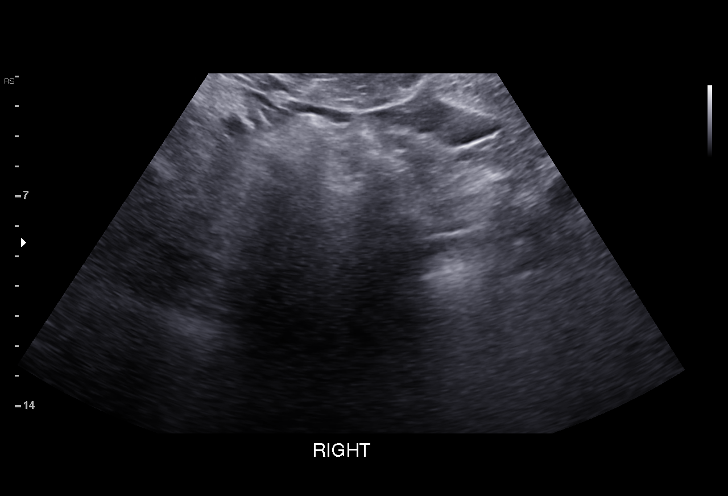
[im 14/67]
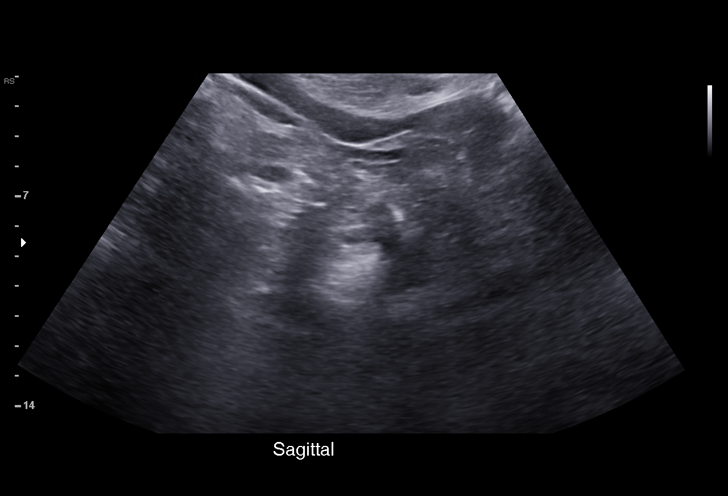
[im 20/67]
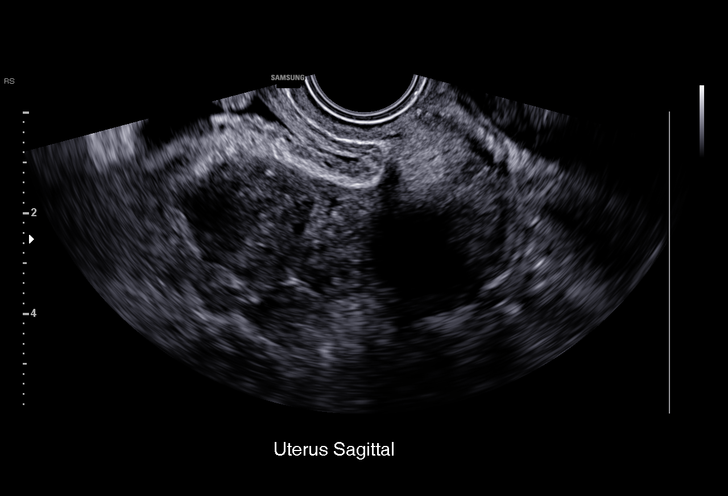
[im 25/67]
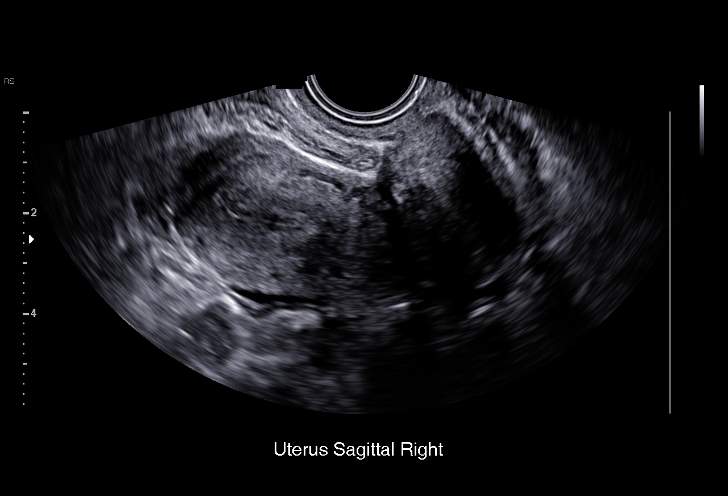
[im 28/67]
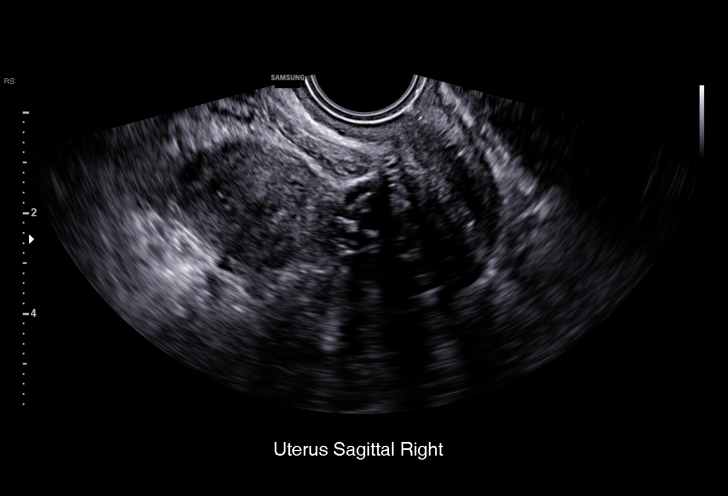
[im 34/67]
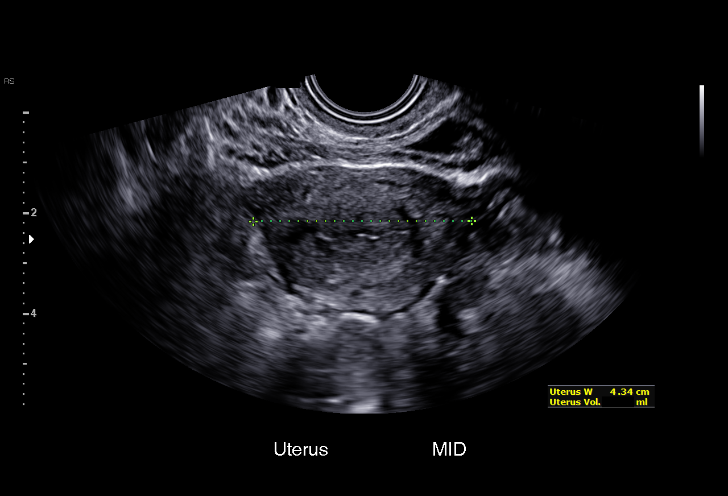
[im 39/67]
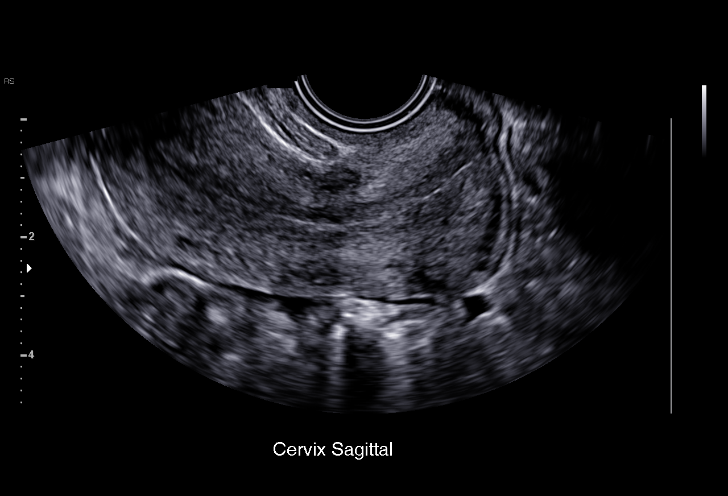
[im 42/67]
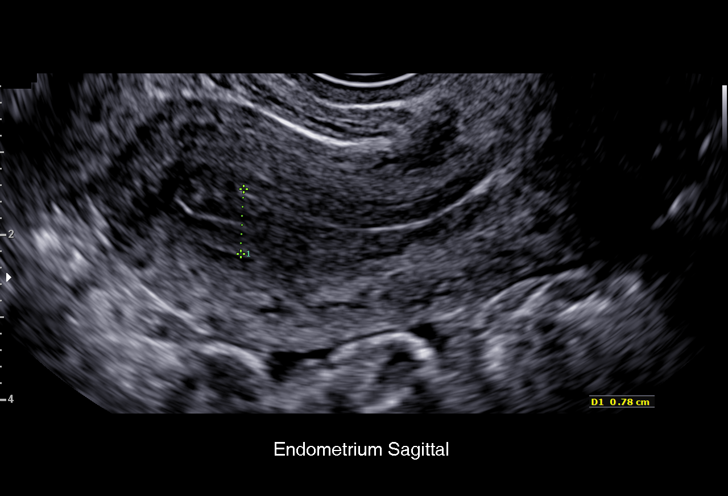
[im 47/67]
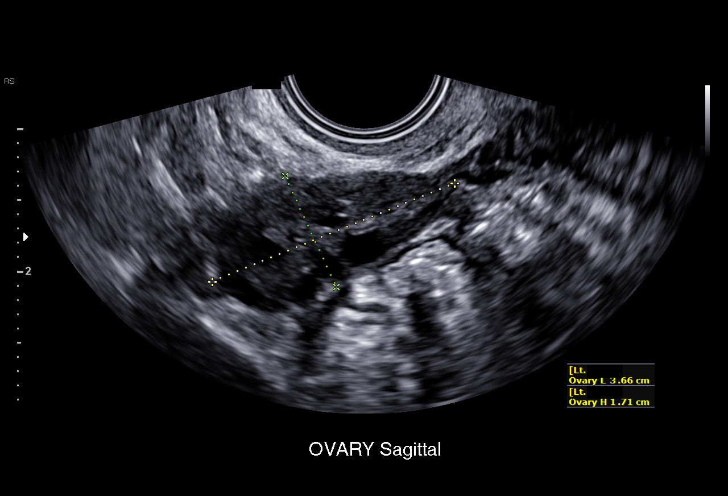
[im 53/67]
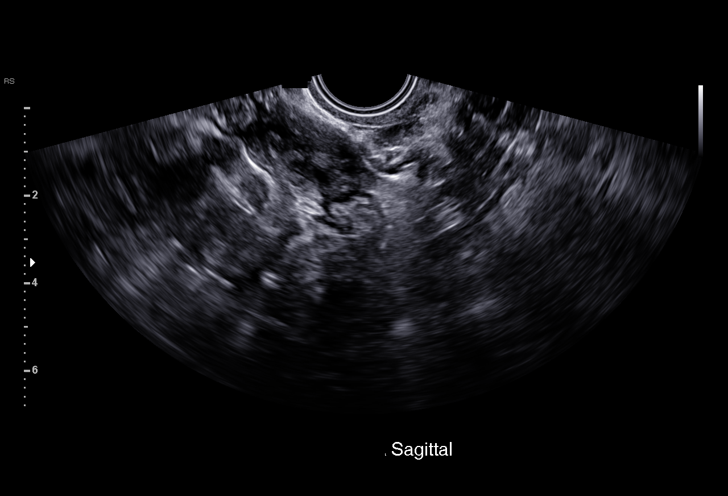
[im 56/67]
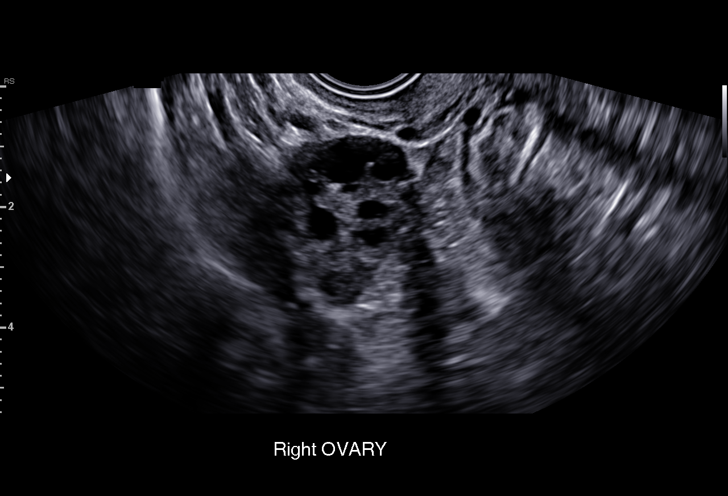
[im 61/67]
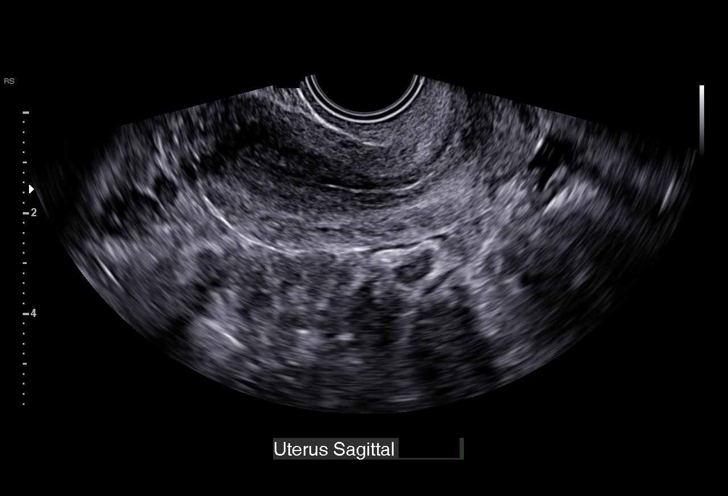
[im 67/67]
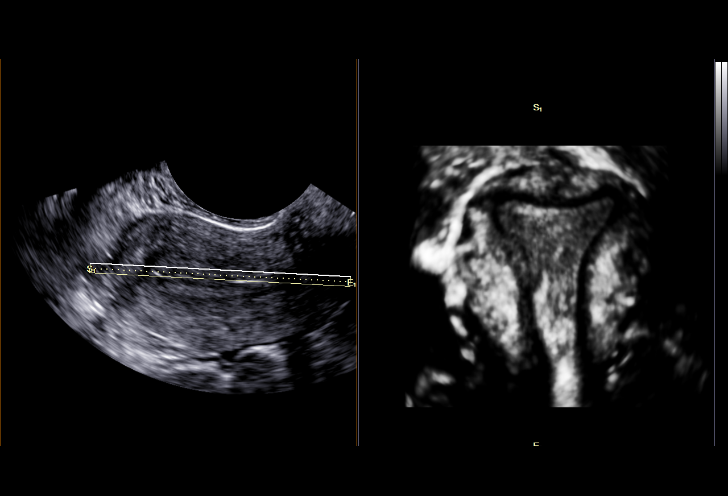

[15 of 25 positions shown; findings below may reference images not displayed]

FINDINGS: Uterus

Measurements: 7.0 x 3.2 x 4.3 cm. Normal morphology without mass

Endometrium

Thickness: 8 mm thick. Trauma enter morphology. No endometrial fluid
or focal abnormality.

Right ovary

Measurements: 3.0 x 2.6 x 2.3 cm. Normal morphology without mass.
Internal blood flow present on color Doppler imaging.

Left ovary

Measurements: 3.7 x 1.7 x 2.3 cm. Normal morphology without mass.
Internal blood flow present on color Doppler imaging.

Other findings

No abnormal free fluid.  No adnexal masses.
IMPRESSION: Normal exam.

## 2017-07-15 ENCOUNTER — Ambulatory Visit (INDEPENDENT_AMBULATORY_CARE_PROVIDER_SITE_OTHER): Payer: Medicaid Other | Admitting: Obstetrics & Gynecology

## 2017-07-15 ENCOUNTER — Encounter: Payer: Self-pay | Admitting: Obstetrics & Gynecology

## 2017-07-15 ENCOUNTER — Other Ambulatory Visit (HOSPITAL_COMMUNITY)
Admission: RE | Admit: 2017-07-15 | Discharge: 2017-07-15 | Disposition: A | Discharge status: Home / Self Care | Payer: Medicaid Other | Source: Ambulatory Visit | Attending: Obstetrics & Gynecology | Admitting: Obstetrics & Gynecology

## 2017-07-15 VITALS — BP 125/85 | HR 81 | Wt 258.6 lb

## 2017-07-15 DIAGNOSIS — Z23 Encounter for immunization: Secondary | ICD-10-CM | POA: Diagnosis not present

## 2017-07-15 DIAGNOSIS — N926 Irregular menstruation, unspecified: Secondary | ICD-10-CM

## 2017-07-15 DIAGNOSIS — B373 Candidiasis of vulva and vagina: Secondary | ICD-10-CM

## 2017-07-15 DIAGNOSIS — Z0000 Encounter for general adult medical examination without abnormal findings: Secondary | ICD-10-CM

## 2017-07-15 DIAGNOSIS — B3731 Acute candidiasis of vulva and vagina: Secondary | ICD-10-CM

## 2017-07-15 DIAGNOSIS — B9689 Other specified bacterial agents as the cause of diseases classified elsewhere: Secondary | ICD-10-CM

## 2017-07-15 DIAGNOSIS — N898 Other specified noninflammatory disorders of vagina: Secondary | ICD-10-CM

## 2017-07-15 DIAGNOSIS — A749 Chlamydial infection, unspecified: Secondary | ICD-10-CM

## 2017-07-15 DIAGNOSIS — A5901 Trichomonal vulvovaginitis: Secondary | ICD-10-CM

## 2017-07-15 DIAGNOSIS — Z01419 Encounter for gynecological examination (general) (routine) without abnormal findings: Secondary | ICD-10-CM | POA: Diagnosis not present

## 2017-07-15 DIAGNOSIS — N760 Acute vaginitis: Secondary | ICD-10-CM

## 2017-07-15 HISTORY — DX: Chlamydial infection, unspecified: A74.9

## 2017-07-15 HISTORY — DX: Trichomonal vulvovaginitis: A59.01

## 2017-07-15 NOTE — Patient Instructions (Signed)
HPV (Human Papillomavirus) Vaccine: What You Need to Know  1. Why get vaccinated?  HPV vaccine prevents infection with human papillomavirus (HPV) types that are associated with many cancers, including:  · cervical cancer in females,  · vaginal and vulvar cancers in females,  · anal cancer in females and males,  · throat cancer in females and males, and  · penile cancer in males.    In addition, HPV vaccine prevents infection with HPV types that cause genital warts in both females and males.  In the [place] about 12,000 women get cervical cancer every year, and about 4,000 women die from it. HPV vaccine can prevent most of these cases of cervical cancer.  Vaccination is not a substitute for cervical cancer screening. This vaccine does not protect against all HPV types that can cause cervical cancer. Women should still get regular Pap tests.  HPV infection usually comes from sexual contact, and most people will become infected at some point in their life. About 14 million Americans, including teens, get infected every year. Most infections will go away on their own and not cause serious problems. But thousands of women and men get cancer and other diseases from HPV.  2. HPV vaccine  HPV vaccine is approved by FDA and is recommended by CDC for both males and females. It is routinely given at 11 or 20 years of age, but it may be given beginning at age 9 years through age 26 years.  Most adolescents 9 through 20 years of age should get HPV vaccine as a two-dose series with the doses separated by 6-12 months. People who start HPV vaccination at 15 years of age and older should get the vaccine as a three-dose series with the second dose given 1-2 months after the first dose and the third dose given 6 months after the first dose. There are several exceptions to these age recommendations. Your health care provider can give you more information.  3. Some people should not get this vaccine   · Anyone who has had a severe (life-threatening) allergic reaction to a dose of HPV vaccine should not get another dose.  · Anyone who has a severe (life threatening) allergy to any component of HPV vaccine should not get the vaccine.  · Tell your doctor if you have any severe allergies that you know of, including a severe allergy to yeast.  · HPV vaccine is not recommended for pregnant women. If you learn that you were pregnant when you were vaccinated, there is no reason to expect any problems for you or your baby. Any woman who learns she was pregnant when she got HPV vaccine is encouraged to contact the manufacturer's registry for HPV vaccination during pregnancy at 1-800-986-8999. Women who are breastfeeding may be vaccinated.  · If you have a mild illness, such as a cold, you can probably get the vaccine today. If you are moderately or severely ill, you should probably wait until you recover. Your doctor can advise you.  4. Risks of a vaccine reaction  With any medicine, including vaccines, there is a chance of side effects. These are usually mild and go away on their own, but serious reactions are also possible.  Most people who get HPV vaccine do not have any serious problems with it.  Mild or moderate problems following HPV vaccine:  · Reactions in the arm where the shot was given:  ? Soreness (about 9 people in 10)  ? Redness or swelling (about 1 person   in 3)  · Fever:  ? Mild (100°F) (about 1 person in 10)  ? Moderate (102°F) (about 1 person in 65)  · Other problems:  ? Headache (about 1 person in 3)  Problems that could happen after any injected vaccine:  · People sometimes faint after a medical procedure, including vaccination. Sitting or lying down for about 15 minutes can help prevent fainting, and injuries caused by a fall. Tell your doctor if you feel dizzy, or have vision changes or ringing in the ears.  · Some people get severe pain in the shoulder and have difficulty moving  the arm where a shot was given. This happens very rarely.  · Any medication can cause a severe allergic reaction. Such reactions from a vaccine are very rare, estimated at about 1 in a million doses, and would happen within a few minutes to a few hours after the vaccination.  As with any medicine, there is a very remote chance of a vaccine causing a serious injury or death.  The safety of vaccines is always being monitored. For more information, visit: www.cdc.gov/vaccinesafety/.  5. What if there is a serious reaction?  What should I look for?  Look for anything that concerns you, such as signs of a severe allergic reaction, very high fever, or unusual behavior.  Signs of a severe allergic reaction can include hives, swelling of the face and throat, difficulty breathing, a fast heartbeat, dizziness, and weakness. These would usually start a few minutes to a few hours after the vaccination.  What should I do?  If you think it is a severe allergic reaction or other emergency that can't wait, call 9-1-1 or get to the nearest hospital. Otherwise, call your doctor.  Afterward, the reaction should be reported to the Vaccine Adverse Event Reporting System (VAERS). Your doctor should file this report, or you can do it yourself through the VAERS web site at www.vaers.hhs.gov, or by calling 1-800-822-7967.  VAERS does not give medical advice.  6. The National Vaccine Injury Compensation Program  The National Vaccine Injury Compensation Program (VICP) is a federal program that was created to compensate people who may have been injured by certain vaccines.  Persons who believe they may have been injured by a vaccine can learn about the program and about filing a claim by calling 1-800-338-2382 or visiting the VICP website at www.hrsa.gov/vaccinecompensation. There is a time limit to file a claim for compensation.  7. How can I learn more?  · Ask your health care provider. He or she can give you the vaccine  package insert or suggest other sources of information.  · Call your local or state health department.  · Contact the Centers for Disease Control and Prevention (CDC):  ? Call 1-800-232-4636 (1-800-CDC-INFO) or  ? Visit CDC’s website at www.cdc.gov/hpv  Vaccine Information Statement, HPV Vaccine (09/01/2015)  This information is not intended to replace advice given to you by your health care provider. Make sure you discuss any questions you have with your health care provider.  Document Released: 04/13/2014 Document Revised: 06/06/2016 Document Reviewed: 06/06/2016  [employer]vier Interactive Patient Education © 2017 [employer]

## 2017-07-15 NOTE — Progress Notes (Signed)
GYNECOLOGY ANNUAL PREVENTATIVE CARE ENCOUNTER NOTE  Subjective:   Brandy [name] is a 21 y.o. G0P0 female here for a routine annual gynecologic exam.  Current complaints: occasional brown spotting after intercourse, causes brown discharge.  Also has irregular periods, thinks she may have PCOS. Doe snot want to be on OCPs, has been on this in the past, "made me feel weird". Trying to lose weight.   Denies pelvic pain, problems with intercourse or other gynecologic concerns.    Gynecologic History No LMP recorded. Contraception: condoms  Obstetric History OB History  Gravida Para Term Preterm AB Living  0            SAB TAB Ectopic Multiple Live Births                   Past Medical History:  Diagnosis Date  . Abdominal pain, recurrent   . ADHD (attention deficit hyperactivity disorder)   . Constipation   . GERD (gastroesophageal reflux disease)   . Nausea     Past Surgical History:  Procedure Laterality Date  . CHOLECYSTECTOMY N/A 11/17/2015   Procedure: LAPAROSCOPIC CHOLECYSTECTOMY ATTEMPTED INTRAOPERATIVE CHOLANGIOGRAM ;  Surgeon: Harriette Bouillon, MD;  Location: [facility] OR;  Service: General;  Laterality: N/A;  . NO PAST SURGERIES      No current outpatient prescriptions on file prior to visit.   No current facility-administered medications on file prior to visit.     No Known Allergies  Social History   Social History  . Marital status: Single    Spouse name: N/A  . Number of children: N/A  . Years of education: N/A   Occupational History  . Not on file.   Social History Main Topics  . Smoking status: Never Smoker  . Smokeless tobacco: Never Used  . Alcohol use No  . Drug use: Yes    Types: Marijuana  . Sexual activity: Not Currently    Birth control/ protection: None     Comment: never had sex   Other Topics Concern  . Not on file   Social History Narrative  . No narrative on file    Family History  Problem Relation Age of Onset  . Diabetes  Maternal Grandfather   . Heart disease Maternal Grandfather     The following portions of the patient's history were reviewed and updated as appropriate: allergies, current medications, past family history, past medical history, past social history, past surgical history and problem list.  Review of Systems Pertinent items noted in HPI and remainder of comprehensive ROS otherwise negative.   Objective:  BP 125/85   Pulse 81   Wt 258 lb 9.6 oz (117.3 kg)   BMI 43.03 kg/m  CONSTITUTIONAL: Well-developed, well-nourished female in no acute distress.  HENT:  Normocephalic, atraumatic, External right and left ear normal. Oropharynx is clear and moist EYES: Conjunctivae and EOM are normal. Pupils are equal, round, and reactive to light. No scleral icterus.  NECK: Normal range of motion, supple, no masses.  Normal thyroid.  SKIN: Skin is warm and dry. No rash noted. Not diaphoretic. No erythema. No pallor. NEUROLOGIC: Alert and oriented to person, place, and time. Normal reflexes, muscle tone coordination. No cranial nerve deficit noted. PSYCHIATRIC: Normal mood and affect. Normal behavior. Normal judgment and thought content. CARDIOVASCULAR: Normal heart rate noted, regular rhythm RESPIRATORY: Clear to auscultation bilaterally. Effort and breath sounds normal, no problems with respiration noted. BREASTS: Symmetric in size. No masses, skin changes, nipple drainage, or  lymphadenopathy. ABDOMEN: Soft, normal bowel sounds, no distention noted.  No tenderness, rebound or guarding.  PELVIC: Normal appearing external genitalia; normal appearing vaginal mucosa and cervix.  No abnormal discharge noted.  Has ectropion (patient has occasional spotting after intercourse, this was attributed to this).  Normal uterine size, no other palpable masses, no uterine or adnexal tenderness. MUSCULOSKELETAL: Normal range of motion. No tenderness.  No cyanosis, clubbing, or edema.  2+ distal pulses.   Assessment and  Plan:  1. Well woman exam Normal exam, no need for pap until 22. Thinks she got HPV vaccine series, will verify.  2. Obesity, morbid, BMI 40.0-49.9 (HCC) Weight loss recommended for period regulation.  3. Irregular periods Evaluation for PCOS done. Had normal ultrasound in 09/2016. - Testosterone,Free and Total If she does have PCOS, will continue to encourage weight loss, consider Metformin.  4. Need for immunization against influenza - Flu Vaccine QUAD 36+ mos IM (Fluarix, Quad PF)  5. Vaginal discharge Has ectropion (patient has occasional spotting after intercourse, this was attributed to this).  Will follow up test and follow up accordingly. - Cervicovaginal ancillary only  Routine preventative health maintenance measures emphasized. Please refer to After Visit Summary for other counseling recommendations.    Jaynie Collins, MD, FACOG Attending Obstetrician & Gynecologist, [facility] and Center for [facility]

## 2017-07-16 LAB — TESTOSTERONE,FREE AND TOTAL
Testosterone, Free: 1.1 pg/mL
Testosterone: 25 ng/dL

## 2017-07-17 ENCOUNTER — Encounter: Payer: Self-pay | Admitting: Obstetrics & Gynecology

## 2017-07-17 DIAGNOSIS — N926 Irregular menstruation, unspecified: Secondary | ICD-10-CM | POA: Insufficient documentation

## 2017-07-17 LAB — CERVICOVAGINAL ANCILLARY ONLY
Bacterial vaginitis: POSITIVE — AB
CHLAMYDIA, DNA PROBE: POSITIVE — AB
Candida vaginitis: POSITIVE — AB
Neisseria Gonorrhea: NEGATIVE
TRICH (WINDOWPATH): POSITIVE — AB

## 2017-07-17 MED ORDER — FLUCONAZOLE 150 MG PO TABS: 150.0000 mg | ORAL_TABLET | Freq: Once | ORAL | 3 refills | Status: AC

## 2017-07-17 MED ORDER — AZITHROMYCIN 500 MG PO TABS: 1000.0000 mg | ORAL_TABLET | Freq: Once | ORAL | 1 refills | Status: AC

## 2017-07-17 MED ORDER — METRONIDAZOLE 500 MG PO TABS: 500.0000 mg | ORAL_TABLET | Freq: Two times a day (BID) | ORAL | 1 refills | Status: DC

## 2017-07-17 NOTE — Progress Notes (Signed)
Lab Addendum  Results for orders placed or performed in visit on 07/15/17 (from the past 72 hour(s))  Cervicovaginal ancillary only     Status: Abnormal   Collection Time: 07/15/17 12:00 AM  Result Value Ref Range   Chlamydia **POSITIVE** (A)     Comment: Normal Reference Range - Negative   Neisseria gonorrhea Negative     Comment: Normal Reference Range - Negative   Bacterial vaginitis **POSITIVE for Gardnerella vaginalis** (A)     Comment: Normal Reference Range - Negative   Candida vaginitis **POSITIVE for Candida species** (A)     Comment: Normal Reference Range - Negative   Trichomonas **POSITIVE** (A)     Comment: Normal Reference Range - Negative  Testosterone,Free and Total     Status: None   Collection Time: 07/15/17  4:00 PM  Result Value Ref Range   Testosterone 25 ng/dL    Comment:                                   FEMALE TANNER STAGE                                  1           <3 -   6                                  2           <3 -  10                                  3           <3 -  24                                  4           <3 -  27                                  5            5  -  38    Testosterone, Free 1.1 Not Estab. pg/mL    Vaginal discharge test is abnormal and showed bacterial vaginitis andyeast infection. Patient also has Trichomonas and Chlamydia which are STIs.  Recommend testing for other STIs, also needs to let partner(s) know so the partner(s) can get testing and treatment. Patient and sex partner(s) should abstain from unprotected sexual activity for seven days after everyone receives appropriate treatment.  Azithromycin (Chlamydia), Metronidazole (Trichomonas and BV) and Diflucan (Candida) were prescribed for patient.    She will need to return for test of cure in about 4 weeks. She will be informed of results and recommendations, and informed that these infections can also contribute to irregular bleeding or spotting after intercourse as this  makes the cervical cells more prone to bleed when touched.   Testosterone levels are normal, no evidence of PCOS, patient already informed via MyChart.  Brandy CollinsUGONNA  ANYANWU, MD, FACOG Attending Obstetrician & Gynecologist, [facility], [place]

## 2017-07-17 NOTE — Addendum Note (Signed)
Addended by: Jaynie CollinsANYANWU, Anadia Helmes A on: 07/17/2017 04:19 PM   Modules accepted: Orders

## 2017-07-18 ENCOUNTER — Telehealth: Payer: Self-pay

## 2017-07-18 MED ORDER — AZITHROMYCIN 250 MG PO TABS: ORAL_TABLET | ORAL | 0 refills | Status: DC

## 2017-07-18 MED ORDER — FLUCONAZOLE 150 MG PO TABS: 150.0000 mg | ORAL_TABLET | Freq: Once | ORAL | 0 refills | Status: AC

## 2017-07-18 MED ORDER — AZITHROMYCIN 250 MG PO TABS: 250.0000 mg | ORAL_TABLET | Freq: Once | ORAL | 0 refills | Status: DC

## 2017-07-18 NOTE — Telephone Encounter (Signed)
Vaginal discharge test is abnormal and showed bacterial vaginitis andyeast infection. Patient also has Trichomonas and Chlamydia which are STIs. Recommend testing for other STIs, also needs to let partner(s) know so the partner(s) can get testing and treatment. Patient and sex partner(s) should abstain from unprotected sexual activity for seven days after everyone receives appropriate treatment. Azithromycin (Chlamydia), Metronidazole (Trichomonas and BV) and Diflucan (Candida) were prescribed for patient. Please call to inform patient of results and recommendations, and advise to pick up prescriptions. She will need to return for test of cure in about 4 weeks. Please let her know that these infections can also contribute to irregular bleeding or spotting after intercourse as this makes the cervical cells more prone to bleed when touched.   Called patient inform her of the Positive results and need to be treated.

## 2017-08-05 ENCOUNTER — Ambulatory Visit: Payer: Medicaid Other | Admitting: Obstetrics & Gynecology

## 2017-08-05 ENCOUNTER — Ambulatory Visit: Payer: Medicaid Other

## 2017-08-14 ENCOUNTER — Other Ambulatory Visit (HOSPITAL_COMMUNITY)
Admission: RE | Admit: 2017-08-14 | Discharge: 2017-08-14 | Disposition: A | Discharge status: Home / Self Care | Payer: Medicaid Other | Source: Ambulatory Visit | Attending: Obstetrics & Gynecology | Admitting: Obstetrics & Gynecology

## 2017-08-14 ENCOUNTER — Other Ambulatory Visit (INDEPENDENT_AMBULATORY_CARE_PROVIDER_SITE_OTHER): Payer: Medicaid Other

## 2017-08-14 VITALS — BP 135/80 | HR 72

## 2017-08-14 DIAGNOSIS — N898 Other specified noninflammatory disorders of vagina: Secondary | ICD-10-CM

## 2017-08-14 DIAGNOSIS — Z3202 Encounter for pregnancy test, result negative: Secondary | ICD-10-CM | POA: Diagnosis not present

## 2017-08-14 DIAGNOSIS — Z202 Contact with and (suspected) exposure to infections with a predominantly sexual mode of transmission: Secondary | ICD-10-CM | POA: Diagnosis not present

## 2017-08-14 LAB — POCT URINE PREGNANCY: Preg Test, Ur: NEGATIVE

## 2017-08-14 NOTE — Addendum Note (Signed)
Addended by: Cheree DittoGRAHAM, Alece Koppel A on: 08/14/2017 11:34 AM   Modules accepted: Orders

## 2017-08-14 NOTE — Progress Notes (Signed)
Patient seen and assessed by nursing staff.  Agree with documentation and plan.  

## 2017-08-14 NOTE — Progress Notes (Addendum)
Patient presented to the office today for follow up on trich and gc TOC self swab. Patient tested positive for trich,bv,yeast and chlamydia. Patient is also requesting a pregnancy test because she had unprotected sex. UPT is negative at this time. Informed patient we will call her once we received her test  results back. Patient voice understanding at this time.

## 2017-08-15 ENCOUNTER — Other Ambulatory Visit: Payer: Self-pay | Admitting: Family Medicine

## 2017-08-15 DIAGNOSIS — N760 Acute vaginitis: Principal | ICD-10-CM

## 2017-08-15 DIAGNOSIS — B9689 Other specified bacterial agents as the cause of diseases classified elsewhere: Secondary | ICD-10-CM

## 2017-08-15 DIAGNOSIS — A5901 Trichomonal vulvovaginitis: Secondary | ICD-10-CM

## 2017-08-15 LAB — CERVICOVAGINAL ANCILLARY ONLY
Bacterial vaginitis: POSITIVE — AB
CANDIDA VAGINITIS: POSITIVE — AB
CHLAMYDIA, DNA PROBE: NEGATIVE
NEISSERIA GONORRHEA: NEGATIVE
TRICH (WINDOWPATH): NEGATIVE

## 2017-08-15 MED ORDER — METRONIDAZOLE 500 MG PO TABS: 500.0000 mg | ORAL_TABLET | Freq: Two times a day (BID) | ORAL | 1 refills | Status: DC

## 2017-08-15 MED ORDER — FLUCONAZOLE 150 MG PO TABS: 150.0000 mg | ORAL_TABLET | Freq: Every day | ORAL | 2 refills | Status: DC

## 2018-01-22 ENCOUNTER — Emergency Department (HOSPITAL_COMMUNITY)
Admission: EM | Admit: 2018-01-22 | Discharge: 2018-01-23 | Disposition: A | Discharge status: Home / Self Care | Payer: Medicaid Other | Attending: Emergency Medicine | Admitting: Emergency Medicine

## 2018-01-22 ENCOUNTER — Encounter (HOSPITAL_COMMUNITY): Payer: Self-pay | Admitting: Emergency Medicine

## 2018-01-22 ENCOUNTER — Emergency Department (HOSPITAL_COMMUNITY): Payer: Medicaid Other

## 2018-01-22 DIAGNOSIS — Y9301 Activity, walking, marching and hiking: Secondary | ICD-10-CM | POA: Diagnosis not present

## 2018-01-22 DIAGNOSIS — Y929 Unspecified place or not applicable: Secondary | ICD-10-CM | POA: Insufficient documentation

## 2018-01-22 DIAGNOSIS — S92354A Nondisplaced fracture of fifth metatarsal bone, right foot, initial encounter for closed fracture: Secondary | ICD-10-CM | POA: Diagnosis not present

## 2018-01-22 DIAGNOSIS — S99921A Unspecified injury of right foot, initial encounter: Secondary | ICD-10-CM | POA: Diagnosis present

## 2018-01-22 DIAGNOSIS — X500XXA Overexertion from strenuous movement or load, initial encounter: Secondary | ICD-10-CM | POA: Diagnosis not present

## 2018-01-22 DIAGNOSIS — Y999 Unspecified external cause status: Secondary | ICD-10-CM | POA: Insufficient documentation

## 2018-01-22 MED ORDER — HYDROCODONE-ACETAMINOPHEN 5-325 MG PO TABS
1.0000 | ORAL_TABLET | Freq: Once | ORAL | Status: AC
  Administered 2018-01-22: 1 via ORAL
  Filled 2018-01-22: qty 1

## 2018-01-22 MED ORDER — TRAMADOL HCL 50 MG PO TABS: 50.0000 mg | ORAL_TABLET | Freq: Four times a day (QID) | ORAL | 0 refills | Status: DC | PRN

## 2018-01-22 MED ORDER — NAPROXEN 500 MG PO TABS: 500.0000 mg | ORAL_TABLET | Freq: Two times a day (BID) | ORAL | 0 refills | Status: DC

## 2018-01-22 NOTE — ED Triage Notes (Signed)
Pt c/o left foot pain and swelling after a fall at work. Denies hitting her head/LOC.

## 2018-01-22 NOTE — ED Notes (Signed)
Ortho Tech paged for splint & crutches.

## 2018-01-22 NOTE — ED Provider Notes (Signed)
[facility] EMERGENCY DEPARTMENT Provider Note   CSN: [id number] Arrival date & time: 01/22/18  2046     History   Chief Complaint Chief Complaint  Patient presents with  . Foot Pain    HPI Brandy [name] is a 22 y.o. female.  HPI Brandy [name] is a 22 y.o. female presents to emergency department complaining of right foot injury.  Patient states that she was walking and stepped off uneven surface and twisted her right foot and fell to the ground.  She states since then swelling and pain to the right foot.  She reports pain with walking.  She reports also pain with movement of the ankle.    She denies any other injuries.  Denies any numbness or tingling.  No prior foot injuries.  No treatment prior to coming in.  This happened approximately 5 hours ago.  Past Medical History:  Diagnosis Date  . Abdominal pain, recurrent   . ADHD (attention deficit hyperactivity disorder)   . Constipation   . GERD (gastroesophageal reflux disease)   . Nausea     Patient Active Problem List   Diagnosis Date Noted  . Irregular menstrual bleeding 07/17/2017  . Obesity, morbid, BMI 40.0-49.9 (HCC) 09/11/2016  . Cholecystitis with cholelithiasis 11/16/2015  . Constipation   . Nausea   . Abdominal pain, recurrent     Past Surgical History:  Procedure Laterality Date  . CHOLECYSTECTOMY N/A 11/17/2015   Procedure: LAPAROSCOPIC CHOLECYSTECTOMY ATTEMPTED INTRAOPERATIVE CHOLANGIOGRAM ;  Surgeon: Harriette Bouillon, MD;  Location: [facility] OR;  Service: General;  Laterality: N/A;  . NO PAST SURGERIES       OB History    Gravida  0   Para      Term      Preterm      AB      Living        SAB      TAB      Ectopic      Multiple      Live Births               Home Medications    Prior to Admission medications   Medication Sig Start Date End Date Taking? Authorizing Provider  fluconazole (DIFLUCAN) 150 MG tablet Take 1 tablet (150 mg total) daily by mouth.  Repeat in 24 hours if needed 08/15/17   Reva Bores, MD  metroNIDAZOLE (FLAGYL) 500 MG tablet Take 1 tablet (500 mg total) 2 (two) times daily by mouth. 08/15/17   Reva Bores, MD    Family History Family History  Problem Relation Age of Onset  . Diabetes Maternal Grandfather   . Heart disease Maternal Grandfather     Social History Social History   Tobacco Use  . Smoking status: Never Smoker  . Smokeless tobacco: Never Used  Substance Use Topics  . Alcohol use: No  . Drug use: Yes    Types: Marijuana     Allergies   Patient has no known allergies.   Review of Systems Review of Systems  Constitutional: Negative for chills and fever.  Musculoskeletal: Positive for arthralgias, gait problem and joint swelling.  Neurological: Negative for weakness and numbness.  All other systems reviewed and are negative.    Physical Exam Updated Vital Signs BP 129/84 (BP Location: Left Arm)   Pulse 93   Temp 98.2 F (36.8 C) (Oral)   Resp 18   SpO2 100%   Physical Exam  Constitutional:  She appears well-developed and well-nourished. No distress.  Eyes: Conjunctivae are normal.  Neck: Neck supple.  Musculoskeletal:  Obvious swelling noted to the dorsal lateral right foot.  There is tenderness of the fourth and fifth metatarsals.  There is significant swelling and tenderness over the base of the fifth metatarsal.  Normal toes otherwise.  No tenderness over medial lateral malleolus of the ankle.  No tenderness of Achilles tendon.  No tenderness over proximal fibula.  Pain with range of motion of the ankle.  Neurological: She is alert.  Skin: Skin is warm and dry.  Nursing note and vitals reviewed.    ED Treatments / Results  Labs (all labs ordered are listed, but only abnormal results are displayed) Labs Reviewed - No data to display  EKG None  Radiology Dg Foot Complete Right  Result Date: 01/22/2018 CLINICAL DATA:  Larey SeatFell at work tonight landing on RIGHT foot.  Lateral foot pain. EXAM: RIGHT FOOT COMPLETE - 3+ VIEW COMPARISON:  None. FINDINGS: Acute comminuted base of fifth metatarsus fracture in alignment, slight distraction. No dislocation. No destructive bony lesions. Lateral midfoot soft tissue swelling without subcutaneous gas or radiopaque foreign bodies. IMPRESSION: Acute nondisplaced base of fifth metatarsus fracture. Electronically Signed   By: Awilda Metroourtnay  Bloomer M.D.   On: 01/22/2018 22:05    Procedures Procedures (including critical care time)  Medications Ordered in ED Medications  HYDROcodone-acetaminophen (NORCO/VICODIN) 5-325 MG per tablet 1 tablet (has no administration in time range)     Initial Impression / Assessment and Plan / ED Course  I have reviewed the triage vital signs and the nursing notes.  Pertinent labs & imaging results that were available during my care of the patient were reviewed by me and considered in my medical decision making (see chart for details).     Patient in the emergency department with foot pain after twisting her right foot and falling down.  X-ray shows acute nondisplaced comminuted fracture of the base of the fifth metatarsal.  It is not well alignment.  I reviewed the x-ray.  Will place in a posterior splint and crutches.  Nonweightbearing until he sees orthopedics.  Home with tramadol and naproxen.  Follow-up as referred.  Neurovascularly intact.  Stable for discharge home.  Vitals:   01/22/18 2140  BP: 129/84  Pulse: 93  Resp: 18  Temp: 98.2 F (36.8 C)  TempSrc: Oral  SpO2: 100%     Final Clinical Impressions(s) / ED Diagnoses   Final diagnoses:  Nondisplaced fracture of fifth metatarsal bone, right foot, initial encounter for closed fracture    ED Discharge Orders        Ordered    traMADol (ULTRAM) 50 MG tablet  Every 6 hours PRN     01/22/18 2342    naproxen (NAPROSYN) 500 MG tablet  2 times daily     01/22/18 2342       Jaynie CrumbleKirichenko, Shabrea Weldin, PA-C 01/22/18 2344      Mesner, Barbara CowerJason, MD 01/26/18 2140

## 2018-01-22 NOTE — Discharge Instructions (Addendum)
Ice and elevate your foot.  Take naproxen for pain.  Tramadol for severe pain.  Use crutches for ambulation.  Please follow-up with Dr. Jena GaussHaddix as referred next week.

## 2018-01-23 NOTE — Progress Notes (Signed)
Orthopedic Tech Progress Note Patient Details:  Brandy [name] 01-19-1996 [id number]  Ortho Devices Type of Ortho Device: Post (short leg) splint, Crutches Ortho Device/Splint Location: rle Ortho Device/Splint Interventions: Ordered, Application, Adjustment   Post Interventions Patient Tolerated: Well Instructions Provided: Care of device, Adjustment of device   Trinna PostMartinez, [name] 01/23/2018, 1:22 AM

## 2018-01-23 NOTE — ED Notes (Signed)
Pt departed in NAD.  

## 2018-01-29 ENCOUNTER — Encounter (HOSPITAL_COMMUNITY): Payer: Self-pay | Admitting: Emergency Medicine

## 2018-01-29 ENCOUNTER — Other Ambulatory Visit: Payer: Self-pay

## 2018-01-29 ENCOUNTER — Emergency Department (HOSPITAL_COMMUNITY)
Admission: EM | Admit: 2018-01-29 | Discharge: 2018-01-30 | Disposition: A | Discharge status: Home / Self Care | Payer: Medicaid Other | Attending: Emergency Medicine | Admitting: Emergency Medicine

## 2018-01-29 DIAGNOSIS — Z79899 Other long term (current) drug therapy: Secondary | ICD-10-CM | POA: Diagnosis not present

## 2018-01-29 DIAGNOSIS — R1110 Vomiting, unspecified: Secondary | ICD-10-CM | POA: Diagnosis present

## 2018-01-29 DIAGNOSIS — R1115 Cyclical vomiting syndrome unrelated to migraine: Secondary | ICD-10-CM

## 2018-01-29 DIAGNOSIS — G43A0 Cyclical vomiting, not intractable: Secondary | ICD-10-CM | POA: Diagnosis not present

## 2018-01-29 DIAGNOSIS — F909 Attention-deficit hyperactivity disorder, unspecified type: Secondary | ICD-10-CM | POA: Insufficient documentation

## 2018-01-29 LAB — URINALYSIS, ROUTINE W REFLEX MICROSCOPIC
BILIRUBIN URINE: NEGATIVE
Glucose, UA: NEGATIVE mg/dL
HGB URINE DIPSTICK: NEGATIVE
KETONES UR: 20 mg/dL — AB
Nitrite: NEGATIVE
PROTEIN: NEGATIVE mg/dL
SPECIFIC GRAVITY, URINE: 1.012 (ref 1.005–1.030)
pH: 6 (ref 5.0–8.0)

## 2018-01-29 LAB — COMPREHENSIVE METABOLIC PANEL
ALT: 9 U/L — AB (ref 14–54)
AST: 19 U/L (ref 15–41)
Albumin: 3.6 g/dL (ref 3.5–5.0)
Alkaline Phosphatase: 82 U/L (ref 38–126)
Anion gap: 9 (ref 5–15)
CHLORIDE: 105 mmol/L (ref 101–111)
CO2: 23 mmol/L (ref 22–32)
CREATININE: 0.65 mg/dL (ref 0.44–1.00)
Calcium: 9 mg/dL (ref 8.9–10.3)
GFR calc non Af Amer: >60 mL/min (ref >60)
Glucose, Bld: 99 mg/dL (ref 65–99)
Potassium: 3.9 mmol/L (ref 3.5–5.1)
SODIUM: 137 mmol/L (ref 135–145)
Total Bilirubin: 0.4 mg/dL (ref 0.3–1.2)
Total Protein: 7.5 g/dL (ref 6.5–8.1)

## 2018-01-29 LAB — CBC
HCT: 34.7 % — ABNORMAL LOW (ref 36.0–46.0)
Hemoglobin: 10.9 g/dL — ABNORMAL LOW (ref 12.0–15.0)
MCH: 24.7 pg — ABNORMAL LOW (ref 26.0–34.0)
MCHC: 31.4 g/dL (ref 30.0–36.0)
MCV: 78.5 fL (ref 78.0–100.0)
PLATELETS: 479 10*3/uL — AB (ref 150–400)
RBC: 4.42 MIL/uL (ref 3.87–5.11)
RDW: 14.5 % (ref 11.5–15.5)
WBC: 11.6 10*3/uL — AB (ref 4.0–10.5)

## 2018-01-29 LAB — LIPASE, BLOOD: LIPASE: 24 U/L (ref 11–51)

## 2018-01-29 LAB — I-STAT BETA HCG BLOOD, ED (MC, WL, AP ONLY)

## 2018-01-29 NOTE — ED Triage Notes (Signed)
Pt reports emesis X3 days after attempting to take her Tramadol and Naproxen for fx to R foot.

## 2018-01-30 MED ORDER — HYDROCODONE-ACETAMINOPHEN 5-325 MG PO TABS: 1.0000 | ORAL_TABLET | Freq: Four times a day (QID) | ORAL | 0 refills | Status: DC | PRN

## 2018-01-30 NOTE — ED Provider Notes (Signed)
[facility] EMERGENCY DEPARTMENT Provider Note   CSN: [id number] Arrival date & time: 01/29/18  2050     History   Chief Complaint Chief Complaint  Patient presents with  . Emesis    HPI Brandy [name] is a 22 y.o. female.  The history is provided by the patient and a relative.  Emesis   This is a new problem. The problem has been gradually improving. The emesis has an appearance of stomach contents. There has been no fever. Pertinent negatives include no abdominal pain, no diarrhea and no fever.   Pt recently Diagnosed with right foot fracture.  She is been taking naproxen and tramadol.  She usually takes both at the same time, and she will have vomiting.  No fever.  No significant abdominal pain. He does report decreased appetite.  No diarrhea. Past Medical History:  Diagnosis Date  . Abdominal pain, recurrent   . ADHD (attention deficit hyperactivity disorder)   . Constipation   . GERD (gastroesophageal reflux disease)   . Nausea     Patient Active Problem List   Diagnosis Date Noted  . Irregular menstrual bleeding 07/17/2017  . Obesity, morbid, BMI 40.0-49.9 (HCC) 09/11/2016  . Cholecystitis with cholelithiasis 11/16/2015  . Constipation   . Nausea   . Abdominal pain, recurrent     Past Surgical History:  Procedure Laterality Date  . CHOLECYSTECTOMY N/A 11/17/2015   Procedure: LAPAROSCOPIC CHOLECYSTECTOMY ATTEMPTED INTRAOPERATIVE CHOLANGIOGRAM ;  Surgeon: Harriette Bouillon, MD;  Location: [facility] OR;  Service: General;  Laterality: N/A;  . NO PAST SURGERIES       OB History    Gravida  0   Para      Term      Preterm      AB      Living        SAB      TAB      Ectopic      Multiple      Live Births               Home Medications    Prior to Admission medications   Medication Sig Start Date End Date Taking? Authorizing Provider  naproxen (NAPROSYN) 500 MG tablet Take 1 tablet (500 mg total) by mouth 2 (two) times daily.  01/22/18  Yes Kirichenko, Tatyana, PA-C  HYDROcodone-acetaminophen (NORCO/VICODIN) 5-325 MG tablet Take 1 tablet by mouth every 6 (six) hours as needed for severe pain. 01/30/18   Zadie Rhine, MD    Family History Family History  Problem Relation Age of Onset  . Diabetes Maternal Grandfather   . Heart disease Maternal Grandfather     Social History Social History   Tobacco Use  . Smoking status: Never Smoker  . Smokeless tobacco: Never Used  Substance Use Topics  . Alcohol use: No  . Drug use: Yes    Types: Marijuana     Allergies   Patient has no known allergies.   Review of Systems Review of Systems  Constitutional: Positive for appetite change. Negative for fever.  Gastrointestinal: Positive for vomiting. Negative for abdominal pain and diarrhea.  All other systems reviewed and are negative.    Physical Exam Updated Vital Signs BP 115/66   Pulse 69   Temp 99.2 F (37.3 C) (Oral)   Resp 16   Ht 1.626 m ( )   Wt 117.9 kg (260 lb)   SpO2 100%   BMI 44.63 kg/m   Physical Exam  CONSTITUTIONAL: Well developed/well nourished HEAD: Normocephalic/atraumatic EYES: EOMI/PERRL ENMT: Mucous membranes moist NECK: supple no meningeal signs SPINE/BACK:entire spine nontender CV: S1/S2 noted, no murmurs/rubs/gallops noted LUNGS: Lungs are clear to auscultation bilaterally, no apparent distress ABDOMEN: soft, nontender, no rebound or guarding, bowel sounds noted throughout abdomen GU:no cva tenderness NEURO: Pt is awake/alert/appropriate, moves all extremitiesx4.  EXTREMITIES: Right foot walking boot SKIN: warm, color normal PSYCH: no abnormalities of mood noted, alert and oriented to situation  ED Treatments / Results  Labs (all labs ordered are listed, but only abnormal results are displayed) Labs Reviewed  COMPREHENSIVE METABOLIC PANEL - Abnormal; Notable for the following components:      Result Value   BUN <5 (*)    ALT 9 (*)    All other  components within normal limits  CBC - Abnormal; Notable for the following components:   WBC 11.6 (*)    Hemoglobin 10.9 (*)    HCT 34.7 (*)    MCH 24.7 (*)    Platelets 479 (*)    All other components within normal limits  URINALYSIS, ROUTINE W REFLEX MICROSCOPIC - Abnormal; Notable for the following components:   APPearance HAZY (*)    Ketones, ur 20 (*)    Leukocytes, UA SMALL (*)    Bacteria, UA RARE (*)    All other components within normal limits  LIPASE, BLOOD  I-STAT BETA HCG BLOOD, ED ([facility], [facility], [facility] ONLY)    EKG None  Radiology No results found.  Procedures Procedures (including critical care time)  Medications Ordered in ED Medications - No data to display   Initial Impression / Assessment and Plan / ED Course  I have reviewed the triage vital signs and the nursing notes.  Pertinent labs results that were available during my care of the patient were reviewed by me and considered in my medical decision making (see chart for details).     Narcotic database reviewed and considered in decision making   Suspect patient is vomiting due to taking both naproxen and tramadol.  She reports taking ibuprofen previously without vomiting.  I suspect this may be due to her tramadol.  I asked her to throw away her tramadol.  We will give her a short course of Vicodin.  She can continue naproxen.  Otherwise patient is appropriate.  Labs reassuring  Final Clinical Impressions(s) / ED Diagnoses   Final diagnoses:  Non-intractable cyclical vomiting with nausea    ED Discharge Orders        Ordered    HYDROcodone-acetaminophen (NORCO/VICODIN) 5-325 MG tablet  Every 6 hours PRN     01/30/18 0433       Zadie Rhine, MD 01/30/18 0502

## 2018-01-30 NOTE — ED Notes (Signed)
PT states understanding of care given, follow up care, and medication prescribed. PT ambulated from ED to car with a steady gait. 

## 2018-05-28 ENCOUNTER — Encounter: Payer: Self-pay | Admitting: Radiology

## 2018-08-06 ENCOUNTER — Other Ambulatory Visit (HOSPITAL_COMMUNITY)
Admission: RE | Admit: 2018-08-06 | Discharge: 2018-08-06 | Disposition: A | Discharge status: Home / Self Care | Payer: Medicaid Other | Source: Ambulatory Visit | Attending: Obstetrics & Gynecology | Admitting: Obstetrics & Gynecology

## 2018-08-06 ENCOUNTER — Encounter: Payer: Self-pay | Admitting: Obstetrics & Gynecology

## 2018-08-06 ENCOUNTER — Ambulatory Visit (INDEPENDENT_AMBULATORY_CARE_PROVIDER_SITE_OTHER): Payer: Medicaid Other | Admitting: Obstetrics & Gynecology

## 2018-08-06 VITALS — BP 116/81 | HR 80 | Wt 232.0 lb

## 2018-08-06 DIAGNOSIS — Z0000 Encounter for general adult medical examination without abnormal findings: Secondary | ICD-10-CM

## 2018-08-06 DIAGNOSIS — Z01419 Encounter for gynecological examination (general) (routine) without abnormal findings: Secondary | ICD-10-CM | POA: Diagnosis not present

## 2018-08-06 DIAGNOSIS — Z23 Encounter for immunization: Secondary | ICD-10-CM

## 2018-08-06 DIAGNOSIS — IMO0001 Reserved for inherently not codable concepts without codable children: Secondary | ICD-10-CM

## 2018-08-06 NOTE — Patient Instructions (Signed)
HPV (Human Papillomavirus) Vaccine: What You Need to Know 1. Why get vaccinated? HPV vaccine prevents infection with human papillomavirus (HPV) types that are associated with many cancers, including:  cervical cancer in females,  vaginal and vulvar cancers in females,  anal cancer in females and males,  throat cancer in females and males, and  penile cancer in males.  In addition, HPV vaccine prevents infection with HPV types that cause genital warts in both females and males. In the [place] about 12,000 women get cervical cancer every year, and about 4,000 women die from it. HPV vaccine can prevent most of these cases of cervical cancer. Vaccination is not a substitute for cervical cancer screening. This vaccine does not protect against all HPV types that can cause cervical cancer. Women should still get regular Pap tests. HPV infection usually comes from sexual contact, and most people will become infected at some point in their life. About 14 million Americans, including teens, get infected every year. Most infections will go away on their own and not cause serious problems. But thousands of women and men get cancer and other diseases from HPV. 2. HPV vaccine HPV vaccine is approved by FDA and is recommended by CDC for both males and females. It is routinely given at 80 or 21 years of age, but it may be given beginning at age 17 years through age 9 years. Most adolescents 9 through 21 years of age should get HPV vaccine as a two-dose series with the doses separated by 6-12 months. People who start HPV vaccination at 10 years of age and older should get the vaccine as a three-dose series with the second dose given 1-2 months after the first dose and the third dose given 6 months after the first dose. There are several exceptions to these age recommendations. Your health care provider can give you more information. 3. Some people should not get this vaccine  Anyone who has had a severe  (life-threatening) allergic reaction to a dose of HPV vaccine should not get another dose.  Anyone who has a severe (life threatening) allergy to any component of HPV vaccine should not get the vaccine.  Tell your doctor if you have any severe allergies that you know of, including a severe allergy to yeast.  HPV vaccine is not recommended for pregnant women. If you learn that you were pregnant when you were vaccinated, there is no reason to expect any problems for you or your baby. Any woman who learns she was pregnant when she got HPV vaccine is encouraged to contact the manufacturer's registry for HPV vaccination during pregnancy at (424)122-5857. Women who are breastfeeding may be vaccinated.  If you have a mild illness, such as a cold, you can probably get the vaccine today. If you are moderately or severely ill, you should probably wait until you recover. Your doctor can advise you. 4. Risks of a vaccine reaction With any medicine, including vaccines, there is a chance of side effects. These are usually mild and go away on their own, but serious reactions are also possible. Most people who get HPV vaccine do not have any serious problems with it. Mild or moderate problems following HPV vaccine:  Reactions in the arm where the shot was given: ? Soreness (about 9 people in 10) ? Redness or swelling (about 1 person in 3)  Fever: ? Mild (100F) (about 1 person in 10) ? Moderate (102F) (about 1 person in 26)  Other problems: ? Headache (about 1 person  in 3) Problems that could happen after any injected vaccine:  People sometimes faint after a medical procedure, including vaccination. Sitting or lying down for about 15 minutes can help prevent fainting, and injuries caused by a fall. Tell your doctor if you feel dizzy, or have vision changes or ringing in the ears.  Some people get severe pain in the shoulder and have difficulty moving the arm where a shot was given. This happens very  rarely.  Any medication can cause a severe allergic reaction. Such reactions from a vaccine are very rare, estimated at about 1 in a million doses, and would happen within a few minutes to a few hours after the vaccination. As with any medicine, there is a very remote chance of a vaccine causing a serious injury or death. The safety of vaccines is always being monitored. For more information, visit: http://www.aguilar.org/. 5. What if there is a serious reaction? What should I look for? Look for anything that concerns you, such as signs of a severe allergic reaction, very high fever, or unusual behavior. Signs of a severe allergic reaction can include hives, swelling of the face and throat, difficulty breathing, a fast heartbeat, dizziness, and weakness. These would usually start a few minutes to a few hours after the vaccination. What should I do? If you think it is a severe allergic reaction or other emergency that can't wait, call 9-1-1 or get to the nearest hospital. Otherwise, call your doctor. Afterward, the reaction should be reported to the Vaccine Adverse Event Reporting System (VAERS). Your doctor should file this report, or you can do it yourself through the VAERS web site at www.vaers.SamedayNews.es, or by calling [phone]. VAERS does not give medical advice. 6. The National Vaccine Injury Compensation Program The [employer] Vaccine Injury Compensation Program (VICP) is a federal program that was created to compensate people who may have been injured by certain vaccines. Persons who believe they may have been injured by a vaccine can learn about the program and about filing a claim by calling 667-740-1694 or visiting the [place] website at GoldCloset.com.ee. There is a time limit to file a claim for compensation. 7. How can I learn more?  Ask your health care provider. He or she can give you the vaccine package insert or suggest other sources of information.  Call your  local or state health department.  Contact the Centers for Disease Control and Prevention (CDC): ? Call (270)630-8440 (1-800-CDC-INFO) or ? Visit CDC's website at http://sweeney-todd.com/ Vaccine Information Statement, HPV Vaccine (09/01/2015) This information is not intended to replace advice given to you by your health care provider. Make sure you discuss any questions you have with your health care provider. Document Released: 04/13/2014 Document Revised: 06/06/2016 Document Reviewed: 06/06/2016 Elsevier Interactive Patient Education  2017 Elsevier Inc.    Preventing Cervical Cancer Cervical cancer is cancer that grows on the cervix. The cervix is at the bottom of the uterus. It connects the uterus to the vagina. The uterus is where a baby develops during pregnancy. Cancer occurs when cells become abnormal and start to grow out of control. Cervical cancer grows slowly and may not cause any symptoms at first. Over time, the cancer can grow deep into the cervix tissue and spread to other areas. If it is found early, cervical cancer can be treated effectively. You can also take steps to prevent this type of cancer. Most cases of cervical cancer are caused by an STI (sexually transmitted infection) called human papillomavirus (HPV). One way  to reduce your risk of cervical cancer is to avoid infection with the HPV virus. You can do this by practicing safe sex and by getting the HPV vaccine. Getting regular Pap tests is also important because this can help identify changes in cells that could lead to cancer. Your chances of getting this disease can also be reduced by making certain lifestyle changes. How can I protect myself from cervical cancer? Preventing HPV infection  Ask your health care provider about getting the HPV vaccine. If you are 5 years old or younger, you may need to get this vaccine, which is given in three doses over 6 months. This vaccine protects against the types of HPV that could cause  cancer.  Limit the number of people you have sex with. Also avoid having sex with people who have had many sex partners.  Use a latex condom during sex. Getting Pap tests  Get Pap tests regularly, starting at age 71. Talk with your health care provider about how often you need these tests. ? Most women who are 75?21 years of age should have a Pap test every 3 years. ? Most women who are 50?21 years of age should have a Pap test in combination with an HPV test every 5 years. ? Women with a higher risk of cervical cancer, such as those with a weakened immune system or those who have been exposed to the drug diethylstilbestrol (DES), may need more frequent testing. Making other lifestyle changes  Do not use any products that contain nicotine or tobacco, such as cigarettes and e-cigarettes. If you need help quitting, ask your health care provider.  Eat at least 5 servings of fruits and vegetables every day.  Lose weight if you are overweight. Why are these changes important?  These changes and screening tests are designed to address the factors that are known to increase the risk of cervical cancer. Taking these steps is the best way to reduce your risk.  Having regular Pap tests will help identify changes in cells that could lead to cancer. Steps can then be taken to prevent cancer from developing.  These changes will also help find cervical cancer early. This type of cancer can be treated effectively if it is found early. It can be more dangerous and difficult to treat if cancer has grown deep into your cervix or has spread.  In addition to making you less likely to get cervical cancer, these changes will also provide other health benefits, such as the following: ? Practicing safe sex is important for preventing STIs and unplanned pregnancies. ? Avoiding tobacco can reduce your risk for other cancers and health issues. ? Eating a healthy diet and maintaining a healthy weight are good for  your overall health. What can happen if changes are not made? In the early stages, cervical cancer might not have any symptoms. It can take many years for the cancer to grow and get deep into the cervix tissue. This may be happening without you knowing about it. If you develop any symptoms, such as pelvic pain or unusual discharge or bleeding from your vagina, you should see your health care provider right away. If cervical cancer is not found early, you might need treatments such as radiation, chemotherapy, or surgery. In some cases, surgery may mean that you will not be able to get pregnant or carry a pregnancy to term. Where to find support: Talk with your health care provider, school nurse, or local health department for guidance about  screening and vaccination. Some children and teens may be able to get the HPV vaccine free of charge through the U.S. government's Vaccines for Children [facility]) program. Other places that provide vaccinations include:  Public health clinics. Check with your local health department.  [place], where you would pay only what you can afford. To find one near you, check this website: http://lyons.com/  [place]. These are part of a program for Medicare and Medicaid patients who live in rural areas.  The National Breast and Cervical Cancer Early Detection Program also provides breast and cervical cancer screenings and diagnostic services to low-income, uninsured, and underinsured women. Cervical cancer can be passed down through families. Talk with your health care provider or genetic counselor to learn more about genetic testing for cancer. Where to find more information: Learn more about cervical cancer from:  [employer] of Gynecology: WirelessShades.ch  American Cancer Society: www.cancer.org/cancer/cervicalcancer/  [place]. Centers for Disease Control and Prevention:  ParisianParasols.gl  Summary  Talk with your health care provider about getting the HPV vaccine.  Be sure to get regular Pap tests as recommended by your health care provider.  See your health care provider right away if you have any pelvic pain or unusual discharge or bleeding from your vagina. This information is not intended to replace advice given to you by your health care provider. Make sure you discuss any questions you have with your health care provider. Document Released: 10/01/2015 Document Revised: 05/14/2016 Document Reviewed: 05/14/2016 Elsevier Interactive Patient Education  2018 [place] 18-39 Years, Female Preventive care refers to lifestyle choices and visits with your health care provider that can promote health and wellness. What does preventive care include?  A yearly physical exam. This is also called an annual well check.  Dental exams once or twice a year.  Routine eye exams. Ask your health care provider how often you should have your eyes checked.  Personal lifestyle choices, including: ? Daily care of your teeth and gums. ? Regular physical activity. ? Eating a healthy diet. ? Avoiding tobacco and drug use. ? Limiting alcohol use. ? Practicing safe sex. ? Taking vitamin and mineral supplements as recommended by your health care provider. What happens during an annual well check? The services and screenings done by your health care provider during your annual well check will depend on your age, overall health, lifestyle risk factors, and family history of disease. Counseling Your health care provider may ask you questions about your:  Alcohol use.  Tobacco use.  Drug use.  Emotional well-being.  Home and relationship well-being.  Sexual activity.  Eating habits.  Work and work Statistician.  Method of birth control.  Menstrual cycle.  Pregnancy history.  Screening You may have the following tests or  measurements:  Height, weight, and BMI.  Diabetes screening. This is done by checking your blood sugar (glucose) after you have not eaten for a while (fasting).  Blood pressure.  Lipid and cholesterol levels. These may be checked every 5 years starting at age 25.  Skin check.  Hepatitis C blood test.  Hepatitis B blood test.  Sexually transmitted disease (STD) testing.  BRCA-related cancer screening. This may be done if you have a family history of breast, ovarian, tubal, or peritoneal cancers.  Pelvic exam and Pap test. This may be done every 3 years starting at age 38. Starting at age 23, this may be done every 5 years if you have a Pap test  in combination with an HPV test.  Discuss your test results, treatment options, and if necessary, the need for more tests with your health care provider. Vaccines Your health care provider may recommend certain vaccines, such as:  Influenza vaccine. This is recommended every year.  Tetanus, diphtheria, and acellular pertussis (Tdap, Td) vaccine. You may need a Td booster every 10 years.  Varicella vaccine. You may need this if you have not been vaccinated.  HPV vaccine. If you are 42 or younger, you may need three doses over 6 months.  Measles, mumps, and rubella (MMR) vaccine. You may need at least one dose of MMR. You may also need a second dose.  Pneumococcal 13-valent conjugate (PCV13) vaccine. You may need this if you have certain conditions and were not previously vaccinated.  Pneumococcal polysaccharide (PPSV23) vaccine. You may need one or two doses if you smoke cigarettes or if you have certain conditions.  Meningococcal vaccine. One dose is recommended if you are age 49-21 years and a first-year college student living in a residence hall, or if you have one of several medical conditions. You may also need additional booster doses.  Hepatitis A vaccine. You may need this if you have certain conditions or if you travel or work  in places where you may be exposed to hepatitis A.  Hepatitis B vaccine. You may need this if you have certain conditions or if you travel or work in places where you may be exposed to hepatitis B.  Haemophilus influenzae type b (Hib) vaccine. You may need this if you have certain risk factors.  Talk to your health care provider about which screenings and vaccines you need and how often you need them. This information is not intended to replace advice given to you by your health care provider. Make sure you discuss any questions you have with your health care provider. Document Released: 11/12/2001 Document Revised: 06/05/2016 Document Reviewed: 07/18/2015 Elsevier Interactive Patient Education  [employer].

## 2018-08-06 NOTE — Progress Notes (Signed)
GYNECOLOGY ANNUAL PREVENTATIVE CARE ENCOUNTER NOTE  Subjective:   Brandy [name] is a 22 y.o. G0 female here for a routine annual gynecologic exam.  Current complaints: none. Desires annual STI screen.  Denies abnormal vaginal bleeding, discharge, pelvic pain, problems with intercourse or other gynecologic concerns.    Gynecologic History Patient's last menstrual period was 07/16/2018 (exact date). Contraception: none, only sexually active with female partners currently Has not received Gardasil series History of Trichomonas and Chlamydia last year (07/15/17), treated with negative test of cure on 08/14/17.  Obstetric History OB History  Gravida Para Term Preterm AB Living  0            SAB TAB Ectopic Multiple Live Births               Past Medical History:  Diagnosis Date  . ADHD (attention deficit hyperactivity disorder)   . Chlamydia 07/15/2017  . GERD (gastroesophageal reflux disease)   . Trichomonal vaginitis 07/15/2017    Past Surgical History:  Procedure Laterality Date  . CHOLECYSTECTOMY N/A 11/17/2015   Procedure: LAPAROSCOPIC CHOLECYSTECTOMY ATTEMPTED INTRAOPERATIVE CHOLANGIOGRAM ;  Surgeon: Harriette Bouillon, MD;  Location: [facility] OR;  Service: General;  Laterality: N/A;    No current outpatient medications on file prior to visit.   No current facility-administered medications on file prior to visit.     No Known Allergies  Social History:  reports that she has never smoked. She has never used smokeless tobacco. She reports that she has current or past drug history. She reports that she does not drink alcohol.  Family History  Problem Relation Age of Onset  . Diabetes Maternal Grandfather   . Heart disease Maternal Grandfather     The following portions of the patient's history were reviewed and updated as appropriate: allergies, current medications, past family history, past medical history, past social history, past surgical history and problem  list.  Review of Systems Pertinent items noted in HPI and remainder of comprehensive ROS otherwise negative.   Objective:  BP 116/81   Pulse 80   Wt 232 lb (105.2 kg)   LMP 07/16/2018 (Exact Date)   BMI 39.82 kg/m  CONSTITUTIONAL: Well-developed, well-nourished female in no acute distress.  HENT:  Normocephalic, atraumatic, External right and left ear normal. Oropharynx is clear and moist EYES: Conjunctivae and EOM are normal. Pupils are equal, round, and reactive to light. No scleral icterus.  NECK: Normal range of motion, supple, no masses.  Normal thyroid.  SKIN: Skin is warm and dry. No rash noted. Not diaphoretic. No erythema. No pallor. MUSCULOSKELETAL: Normal range of motion. No tenderness.  No cyanosis, clubbing, or edema.  2+ distal pulses. NEUROLOGIC: Alert and oriented to person, place, and time. Normal reflexes, muscle tone coordination. No cranial nerve deficit noted. PSYCHIATRIC: Normal mood and affect. Normal behavior. Normal judgment and thought content. CARDIOVASCULAR: Normal heart rate noted, regular rhythm RESPIRATORY: Clear to auscultation bilaterally. Effort and breath sounds normal, no problems with respiration noted. BREASTS: Symmetric in size. No masses, skin changes, nipple drainage, or lymphadenopathy. ABDOMEN: Soft, normal bowel sounds, no distention noted.  No tenderness, rebound or guarding.  PELVIC: Normal appearing external genitalia; normal appearing distal vaginal mucosa.  Thin, Henneke vaginal discharge noted, testing sample obtained.     Assessment and Plan:  1. Human papilloma virus (HPV) type 9 vaccine administered Counseled about HPV vaccine series, she agreed to get this today. Emphasized importance of getting all three shots. - HPV 9-valent vaccine,Recombinat  2.  Flu vaccine need - Flu Vaccine QUAD 36+ mos IM (Fluarix, Quad PF)  3. Well woman exam Annual STI screen done, will follow up results and manage accordingly. Will start doing pap  smears next year. - Cervicovaginal ancillary only - Hepatitis B surface antigen - Hepatitis C antibody - HIV Antibody (routine testing w rflx) - RPR Routine preventative health maintenance measures emphasized. Please refer to After Visit Summary for other counseling recommendations.    Jaynie Collins, MD, FACOG Obstetrician & Gynecologist, [facility] for [employer], [facility]

## 2018-08-07 LAB — CERVICOVAGINAL ANCILLARY ONLY
Bacterial vaginitis: NEGATIVE
CANDIDA VAGINITIS: NEGATIVE
CHLAMYDIA, DNA PROBE: POSITIVE — AB
NEISSERIA GONORRHEA: NEGATIVE
Trichomonas: NEGATIVE

## 2018-08-07 LAB — HEPATITIS B SURFACE ANTIGEN: Hepatitis B Surface Ag: NEGATIVE

## 2018-08-07 LAB — HIV ANTIBODY (ROUTINE TESTING W REFLEX): HIV Screen 4th Generation wRfx: NONREACTIVE

## 2018-08-07 LAB — RPR: RPR: NONREACTIVE

## 2018-08-07 LAB — HEPATITIS C ANTIBODY: Hep C Virus Ab: <0.1 s/co ratio (ref 0.0–0.9)

## 2018-08-10 ENCOUNTER — Other Ambulatory Visit: Payer: Self-pay | Admitting: *Deleted

## 2018-08-10 MED ORDER — AZITHROMYCIN 500 MG PO TABS: 1000.0000 mg | ORAL_TABLET | Freq: Once | ORAL | 1 refills | Status: AC

## 2018-08-10 NOTE — Telephone Encounter (Signed)
Informed pt of positive Chlamydia results. RX to be called in per protocol. Pt informed partner needs to be treated and to abstain from sex form 7-10 days after treatment. Pt verbalizes and understands.    Scheryl Marten, RN

## 2018-08-10 NOTE — Progress Notes (Signed)
Patient has chlamydia.  Negative testing for other STIs, but needs to let partner(s) know so the partner(s) can get testing and treatment. Patient and sex partner(s) should abstain from unprotected sexual activity for seven days after everyone receives appropriate treatment.  Azithromycin was prescribed for patient.  Patient will need to return in about 4 weeks after treatment for repeat test of cure.  Patient has already been called with these results and prescribed Azithromycin.  Results were then released to MyChart.

## 2018-10-01 ENCOUNTER — Ambulatory Visit: Payer: Medicaid Other

## 2018-10-08 ENCOUNTER — Ambulatory Visit: Payer: Medicaid Other

## 2018-12-17 ENCOUNTER — Emergency Department (HOSPITAL_COMMUNITY)
Admission: EM | Admit: 2018-12-17 | Discharge: 2018-12-17 | Disposition: A | Discharge status: Home / Self Care | Payer: Medicaid Other | Attending: Emergency Medicine | Admitting: Emergency Medicine

## 2018-12-17 ENCOUNTER — Encounter (HOSPITAL_COMMUNITY): Payer: Self-pay | Admitting: Emergency Medicine

## 2018-12-17 ENCOUNTER — Other Ambulatory Visit: Payer: Self-pay

## 2018-12-17 DIAGNOSIS — M7918 Myalgia, other site: Secondary | ICD-10-CM

## 2018-12-17 DIAGNOSIS — Z79899 Other long term (current) drug therapy: Secondary | ICD-10-CM | POA: Insufficient documentation

## 2018-12-17 DIAGNOSIS — H81399 Other peripheral vertigo, unspecified ear: Secondary | ICD-10-CM | POA: Insufficient documentation

## 2018-12-17 DIAGNOSIS — R0981 Nasal congestion: Secondary | ICD-10-CM

## 2018-12-17 DIAGNOSIS — M545 Low back pain: Secondary | ICD-10-CM | POA: Insufficient documentation

## 2018-12-17 LAB — URINALYSIS, ROUTINE W REFLEX MICROSCOPIC
Bacteria, UA: NONE SEEN
Bilirubin Urine: NEGATIVE
Glucose, UA: NEGATIVE mg/dL
Ketones, ur: NEGATIVE mg/dL
Nitrite: NEGATIVE
PH: 6 (ref 5.0–8.0)
PROTEIN: NEGATIVE mg/dL
SPECIFIC GRAVITY, URINE: 1.005 (ref 1.005–1.030)

## 2018-12-17 LAB — PREGNANCY, URINE: Preg Test, Ur: NEGATIVE

## 2018-12-17 MED ORDER — MECLIZINE HCL 25 MG PO TABS: 25.0000 mg | ORAL_TABLET | Freq: Three times a day (TID) | ORAL | 0 refills | Status: DC | PRN

## 2018-12-17 MED ORDER — FLUTICASONE PROPIONATE 50 MCG/ACT NA SUSP: 2.0000 | Freq: Every day | NASAL | 0 refills | Status: DC

## 2018-12-17 MED ORDER — MECLIZINE HCL 25 MG PO TABS
25.0000 mg | ORAL_TABLET | Freq: Once | ORAL | Status: AC
Start: 2018-12-17 — End: 2018-12-17
  Administered 2018-12-17: 25 mg via ORAL
  Filled 2018-12-17: qty 1

## 2018-12-17 MED ORDER — LORATADINE 10 MG PO TABS: 10.0000 mg | ORAL_TABLET | Freq: Every day | ORAL | 0 refills | Status: DC

## 2018-12-17 MED ORDER — METHOCARBAMOL 500 MG PO TABS: 1000.0000 mg | ORAL_TABLET | Freq: Three times a day (TID) | ORAL | 0 refills | Status: DC | PRN

## 2018-12-17 MED ORDER — ACETAMINOPHEN 500 MG PO TABS
1000.0000 mg | ORAL_TABLET | Freq: Once | ORAL | Status: AC
  Administered 2018-12-17: 1000 mg via ORAL
  Filled 2018-12-17: qty 2

## 2018-12-17 NOTE — Discharge Instructions (Signed)
You may also take Tylenol and ibuprofen as needed for pain.

## 2018-12-17 NOTE — ED Provider Notes (Signed)
[facility]ENC[facility] Provider Note   CSN: [id number] Arrival date & time: 12/17/18  1647    History   Chief Complaint Chief Complaint  Patient presents with  . Dizziness  . Back Pain  . Fatigue    HPI Brandy [name] is a [age] y.o. female.     HPI Patient presents with 1 month of frontal headache, intermittent spinning sensation associated with nausea, nasal congestion and ear "popping".  No fever or chills.  Patient states she has low back pain which is mostly on the left side.  Does not radiate into her legs.  Denies focal weakness or numbness.  No incontinence.  States she has been taking ibuprofen at home for her symptoms with little improvement. Past Medical History:  Diagnosis Date  . ADHD (attention deficit hyperactivity disorder)   . Chlamydia 07/15/2017  . GERD (gastroesophageal reflux disease)   . Trichomonal vaginitis 07/15/2017    Patient Active Problem List   Diagnosis Date Noted  . Irregular menstrual bleeding 07/17/2017  . Obesity, morbid, BMI 40.0-49.9 (HCC) 09/11/2016  . Cholecystitis with cholelithiasis 11/16/2015  . Constipation   . Nausea   . Abdominal pain, recurrent     Past Surgical History:  Procedure Laterality Date  . CHOLECYSTECTOMY N/A 11/17/2015   Procedure: LAPAROSCOPIC CHOLECYSTECTOMY ATTEMPTED INTRAOPERATIVE CHOLANGIOGRAM ;  Surgeon: Harriette Bouillonhomas Cornett, MD;  Location: [facility] OR;  Service: General;  Laterality: N/A;     OB History    Gravida  0   Para      Term      Preterm      AB      Living        SAB      TAB      Ectopic      Multiple      Live Births               Home Medications    Prior to Admission medications   Medication Sig Start Date End Date Taking? Authorizing Provider  ibuprofen (ADVIL,MOTRIN) 200 MG tablet Take 200 mg by mouth every 6 (six) hours as needed for headache.   Yes [provider]  fluticasone (FLONASE) 50 MCG/ACT nasal spray Place 2 sprays into  both nostrils daily. 12/17/18   Loren RacerYelverton, Emilygrace Grothe, MD  loratadine (CLARITIN) 10 MG tablet Take 1 tablet (10 mg total) by mouth daily. 12/17/18   Loren RacerYelverton, Nashali Ditmer, MD  meclizine (ANTIVERT) 25 MG tablet Take 1 tablet (25 mg total) by mouth 3 (three) times daily as needed for dizziness. 12/17/18   Loren RacerYelverton, Jabes Primo, MD  methocarbamol (ROBAXIN) 500 MG tablet Take 2 tablets (1,000 mg total) by mouth every 8 (eight) hours as needed for muscle spasms. 12/17/18   Loren RacerYelverton, Josseline Reddin, MD    Family History Family History  Problem Relation Age of Onset  . Diabetes Maternal Grandfather   . Heart disease Maternal Grandfather     Social History Social History   Tobacco Use  . Smoking status: Never Smoker  . Smokeless tobacco: Never Used  Substance Use Topics  . Alcohol use: No  . Drug use: Not Currently     Allergies   Patient has no known allergies.   Review of Systems Review of Systems  Constitutional: Negative for chills and fever.  HENT: Positive for congestion and sinus pressure. Negative for ear pain, sore throat and trouble swallowing.   Eyes: Negative for photophobia and visual disturbance.  Respiratory: Negative for cough and shortness of  breath.   Gastrointestinal: Positive for nausea and vomiting. Negative for abdominal pain, constipation and diarrhea.  Genitourinary: Negative for dysuria, flank pain and frequency.  Musculoskeletal: Positive for back pain and myalgias.  Skin: Negative for rash and wound.  Neurological: Positive for dizziness and headaches. Negative for syncope, weakness, light-headedness and numbness.  All other systems reviewed and are negative.    Physical Exam Updated Vital Signs BP 133/77 (BP Location: Right Arm)   Pulse 78   Temp 99.1 F (37.3 C) (Oral)   Resp 16   Ht 5\' 5"  (1.651 m)   Wt 106.6 kg   SpO2 100%   BMI 39.11 kg/m   Physical Exam Vitals signs and nursing note reviewed.  Constitutional:      Appearance: Normal appearance. She is  well-developed.  HENT:     Head: Normocephalic and atraumatic.     Nose: Congestion present.     Mouth/Throat:     Mouth: Mucous membranes are moist.  Eyes:     Extraocular Movements: Extraocular movements intact.     Conjunctiva/sclera: Conjunctivae normal.     Pupils: Pupils are equal, round, and reactive to light.  Neck:     Musculoskeletal: Normal range of motion and neck supple. No neck rigidity or muscular tenderness.  Cardiovascular:     Rate and Rhythm: Normal rate and regular rhythm.     Heart sounds: No murmur. No gallop.   Pulmonary:     Effort: Pulmonary effort is normal. No respiratory distress.     Breath sounds: Normal breath sounds. No stridor. No wheezing, rhonchi or rales.  Abdominal:     General: Bowel sounds are normal.     Palpations: Abdomen is soft.     Tenderness: There is no abdominal tenderness. There is no right CVA tenderness, left CVA tenderness, guarding or rebound.  Musculoskeletal: Normal range of motion.        General: No tenderness.     Comments: Mild left-sided lumbar paraspinal muscular tenderness to palpation.  No definite CVA tenderness.  No lower extremity swelling, asymmetry or tenderness.  Lymphadenopathy:     Cervical: No cervical adenopathy.  Skin:    General: Skin is warm and dry.     Capillary Refill: Capillary refill takes less than 2 seconds.     Findings: No erythema or rash.  Neurological:     General: No focal deficit present.     Mental Status: She is alert and oriented to person, place, and time.     Comments: Moving all extremities without focal deficit.  Sensation intact.  Psychiatric:        Behavior: Behavior normal.      ED Treatments / Results  Labs (all labs ordered are listed, but only abnormal results are displayed) Labs Reviewed  PREGNANCY, URINE  URINALYSIS, ROUTINE W REFLEX MICROSCOPIC    EKG None  Radiology No results found.  Procedures Procedures (including critical care time)  Medications  Ordered in ED Medications  acetaminophen (TYLENOL) tablet 1,000 mg (1,000 mg Oral Given 12/17/18 1743)  meclizine (ANTIVERT) tablet 25 mg (25 mg Oral Given 12/17/18 1743)     Initial Impression / Assessment and Plan / ED Course  I have reviewed the triage vital signs and the nursing notes.  Pertinent labs & imaging results that were available during my care of the patient were reviewed by me and considered in my medical decision making (see chart for details).        Suspect sinus disease and  peripheral vertigo.  No red flag signs or symptoms for patient's back pain.  Will check urine and urine pregnancy test.  Will treat symptomatically. Patient states she is feeling much better would like to be discharged prior to results of her UA.  Low suspicion that her back pain is due to pyelonephritis.  Strict return precautions have been given. Final Clinical Impressions(s) / ED Diagnoses   Final diagnoses:  Nasal sinus congestion  Peripheral vertigo, unspecified laterality  Myofascial pain    ED Discharge Orders         Ordered    methocarbamol (ROBAXIN) 500 MG tablet  Every 8 hours PRN     12/17/18 1923    meclizine (ANTIVERT) 25 MG tablet  3 times daily PRN     12/17/18 1923    fluticasone (FLONASE) 50 MCG/ACT nasal spray  Daily     12/17/18 1923    loratadine (CLARITIN) 10 MG tablet  Daily     12/17/18 1923           Loren Racer, MD 12/17/18 1924

## 2018-12-17 NOTE — ED Triage Notes (Signed)
Pt complaining of dizziness, weakness, and lower back pain beginning approximately 2 months ago. Pt weakness worsens upon exertion.

## 2019-04-11 ENCOUNTER — Emergency Department (HOSPITAL_COMMUNITY)
Admission: EM | Admit: 2019-04-11 | Discharge: 2019-04-11 | Disposition: A | Discharge status: Home / Self Care | Payer: HRSA Program | Attending: Emergency Medicine | Admitting: Emergency Medicine

## 2019-04-11 ENCOUNTER — Emergency Department (HOSPITAL_COMMUNITY): Payer: HRSA Program

## 2019-04-11 ENCOUNTER — Encounter (HOSPITAL_COMMUNITY): Payer: Self-pay | Admitting: Emergency Medicine

## 2019-04-11 ENCOUNTER — Other Ambulatory Visit: Payer: Self-pay

## 2019-04-11 DIAGNOSIS — R509 Fever, unspecified: Secondary | ICD-10-CM | POA: Insufficient documentation

## 2019-04-11 DIAGNOSIS — Z79899 Other long term (current) drug therapy: Secondary | ICD-10-CM | POA: Diagnosis not present

## 2019-04-11 DIAGNOSIS — R05 Cough: Secondary | ICD-10-CM | POA: Diagnosis present

## 2019-04-11 DIAGNOSIS — U071 COVID-19: Secondary | ICD-10-CM

## 2019-04-11 LAB — CBC WITH DIFFERENTIAL/PLATELET
Abs Immature Granulocytes: 0.01 10*3/uL (ref 0.00–0.07)
Basophils Absolute: 0 10*3/uL (ref 0.0–0.1)
Basophils Relative: 0 %
Eosinophils Absolute: 0 10*3/uL (ref 0.0–0.5)
Eosinophils Relative: 0 %
HCT: 39.2 % (ref 36.0–46.0)
Hemoglobin: 11.9 g/dL — ABNORMAL LOW (ref 12.0–15.0)
Immature Granulocytes: 0 %
Lymphocytes Relative: 34 %
Lymphs Abs: 2.4 10*3/uL (ref 0.7–4.0)
MCH: 24.4 pg — ABNORMAL LOW (ref 26.0–34.0)
MCHC: 30.4 g/dL (ref 30.0–36.0)
MCV: 80.3 fL (ref 80.0–100.0)
Monocytes Absolute: 0.8 10*3/uL (ref 0.1–1.0)
Monocytes Relative: 11 %
Neutro Abs: 3.9 10*3/uL (ref 1.7–7.7)
Neutrophils Relative %: 55 %
Platelets: 330 10*3/uL (ref 150–400)
RBC: 4.88 MIL/uL (ref 3.87–5.11)
RDW: 14 % (ref 11.5–15.5)
WBC: 7 10*3/uL (ref 4.0–10.5)
nRBC: 0 % (ref 0.0–0.2)

## 2019-04-11 LAB — BASIC METABOLIC PANEL
Anion gap: 7 (ref 5–15)
BUN: 7 mg/dL (ref 6–20)
CO2: 26 mmol/L (ref 22–32)
Calcium: 8.7 mg/dL — ABNORMAL LOW (ref 8.9–10.3)
Chloride: 106 mmol/L (ref 98–111)
Creatinine, Ser: 0.51 mg/dL (ref 0.44–1.00)
GFR calc Af Amer: >60 mL/min (ref >60)
GFR calc non Af Amer: >60 mL/min (ref >60)
Glucose, Bld: 83 mg/dL (ref 70–99)
Potassium: 3.7 mmol/L (ref 3.5–5.1)
Sodium: 139 mmol/L (ref 135–145)

## 2019-04-11 LAB — SARS CORONAVIRUS 2 BY RT PCR (HOSPITAL ORDER, PERFORMED IN ~~LOC~~ HOSPITAL LAB): SARS Coronavirus 2: POSITIVE — AB

## 2019-04-11 MED ORDER — SODIUM CHLORIDE 0.9 % IV BOLUS
1000.0000 mL | Freq: Once | INTRAVENOUS | Status: AC
  Administered 2019-04-11: 1000 mL via INTRAVENOUS

## 2019-04-11 MED ORDER — ACETAMINOPHEN 325 MG PO TABS
650.0000 mg | ORAL_TABLET | Freq: Once | ORAL | Status: AC
  Administered 2019-04-11: 650 mg via ORAL
  Filled 2019-04-11: qty 2

## 2019-04-11 NOTE — ED Provider Notes (Signed)
[facility] Provider Note   CSN: [id number] Arrival date & time: 04/11/19  1908     History   Chief Complaint Chief Complaint  Patient presents with  . Cough    HPI Brandy [name] is a 23 y.o. female with past medical history of GERD, ADHD presents emergency department today with chief complaint of cough x1 week.  Patient recently traveled to [place] on 04/02/2019 with a group of 9 friends, 3 of which now have tested positive for COVID.  Patient states she has had a dry nonproductive cough since returning from her trip.  Cough seems worse at night.  Also reports associated generalized body aches and fever.  T-max of 101.4 x 2 days ago.  She has been taking Tylenol with fever relief.  Patient reports has lost sense of taste 6 days ago.   She denies any shortness of breath, chest pain, abdominal pain, nausea, vomiting, urinary symptoms, diarrhea.   Past Medical History:  Diagnosis Date  . ADHD (attention deficit hyperactivity disorder)   . Chlamydia 07/15/2017  . GERD (gastroesophageal reflux disease)   . Trichomonal vaginitis 07/15/2017    Patient Active Problem List   Diagnosis Date Noted  . Irregular menstrual bleeding 07/17/2017  . Obesity, morbid, BMI 40.0-49.9 (HCC) 09/11/2016  . Cholecystitis with cholelithiasis 11/16/2015  . Constipation   . Nausea   . Abdominal pain, recurrent     Past Surgical History:  Procedure Laterality Date  . CHOLECYSTECTOMY N/A 11/17/2015   Procedure: LAPAROSCOPIC CHOLECYSTECTOMY ATTEMPTED INTRAOPERATIVE CHOLANGIOGRAM ;  Surgeon: Harriette Bouillonhomas Cornett, MD;  Location: [facility] OR;  Service: General;  Laterality: N/A;     OB History    Gravida  0   Para      Term      Preterm      AB      Living        SAB      TAB      Ectopic      Multiple      Live Births               Home Medications    Prior to Admission medications   Medication Sig Start Date End Date Taking? Authorizing  Provider  fluticasone (FLONASE) 50 MCG/ACT nasal spray Place 2 sprays into both nostrils daily. 12/17/18   Loren RacerYelverton, David, MD  ibuprofen (ADVIL,MOTRIN) 200 MG tablet Take 200 mg by mouth every 6 (six) hours as needed for headache.    [provider]  loratadine (CLARITIN) 10 MG tablet Take 1 tablet (10 mg total) by mouth daily. 12/17/18   Loren RacerYelverton, David, MD  meclizine (ANTIVERT) 25 MG tablet Take 1 tablet (25 mg total) by mouth 3 (three) times daily as needed for dizziness. 12/17/18   Loren RacerYelverton, David, MD  methocarbamol (ROBAXIN) 500 MG tablet Take 2 tablets (1,000 mg total) by mouth every 8 (eight) hours as needed for muscle spasms. 12/17/18   Loren RacerYelverton, David, MD    Family History Family History  Problem Relation Age of Onset  . Diabetes Maternal Grandfather   . Heart disease Maternal Grandfather     Social History Social History   Tobacco Use  . Smoking status: Never Smoker  . Smokeless tobacco: Never Used  Substance Use Topics  . Alcohol use: No  . Drug use: Not Currently     Allergies   Patient has no known allergies.   Review of Systems Review of Systems  Constitutional: Positive  for fever. Negative for chills.  HENT: Negative for congestion, ear discharge, ear pain, sinus pressure, sinus pain and sore throat.   Eyes: Negative for pain and redness.  Respiratory: Positive for cough. Negative for shortness of breath.   Cardiovascular: Negative for chest pain.  Gastrointestinal: Negative for abdominal pain, constipation, diarrhea, nausea and vomiting.  Genitourinary: Negative for dysuria and hematuria.  Musculoskeletal: Negative for back pain and neck pain.  Skin: Negative for wound.  Neurological: Negative for weakness, numbness and headaches.     Physical Exam Updated Vital Signs BP 128/81   Pulse 83   Temp 98.6 F (37 C) (Oral)   Resp 16   LMP 03/20/2019 Comment: pt denies pregnancy April 11, 2019  SpO2 99%   Physical Exam Vitals signs and  nursing note reviewed.  Constitutional:      General: She is not in acute distress.    Appearance: She is not ill-appearing.  HENT:     Head: Normocephalic and atraumatic.     Right Ear: Tympanic membrane and external ear normal.     Left Ear: Tympanic membrane and external ear normal.     Nose: Nose normal.     Mouth/Throat:     Mouth: Mucous membranes are dry.     Pharynx: Oropharynx is clear.  Eyes:     General: No scleral icterus.       Right eye: No discharge.        Left eye: No discharge.     Extraocular Movements: Extraocular movements intact.     Conjunctiva/sclera: Conjunctivae normal.     Pupils: Pupils are equal, round, and reactive to light.  Neck:     Musculoskeletal: Normal range of motion.     Vascular: No JVD.  Cardiovascular:     Rate and Rhythm: Normal rate and regular rhythm.     Pulses: Normal pulses.          Radial pulses are 2+ on the right side and 2+ on the left side.     Heart sounds: Normal heart sounds.  Pulmonary:     Comments: Lungs clear to auscultation in all fields. Symmetric chest rise. No wheezing, rales, or rhonchi. Abdominal:     Comments: Abdomen is soft, non-distended, and non-tender in all quadrants. No rigidity, no guarding. No peritoneal signs.  Musculoskeletal: Normal range of motion.  Skin:    General: Skin is warm and dry.     Capillary Refill: Capillary refill takes less than 2 seconds.  Neurological:     Mental Status: She is oriented to person, place, and time.     GCS: GCS eye subscore is 4. GCS verbal subscore is 5. GCS motor subscore is 6.     Comments: Fluent speech, no facial droop.  Psychiatric:        Behavior: Behavior normal.      ED Treatments / Results  Labs (all labs ordered are listed, but only abnormal results are displayed) Labs Reviewed  SARS CORONAVIRUS 2 (HOSPITAL ORDER, [place] [facility]) - Abnormal; Notable for the following components:      Result Value   SARS Coronavirus 2  POSITIVE (*)    All other components within normal limits  BASIC METABOLIC PANEL - Abnormal; Notable for the following components:   Calcium 8.7 (*)    All other components within normal limits  CBC WITH DIFFERENTIAL/PLATELET - Abnormal; Notable for the following components:   Hemoglobin 11.9 (*)    MCH 24.4 (*)  All other components within normal limits    EKG None  Radiology Dg Chest Portable 1 View  Result Date: 04/11/2019 CLINICAL DATA:  Cough.  Known code exposure. EXAM: PORTABLE CHEST 1 VIEW COMPARISON:  November 16, 2015 FINDINGS: The heart size and mediastinal contours are within normal limits. Both lungs are clear. The visualized skeletal structures are unremarkable. IMPRESSION: No active disease. Electronically Signed   By: Gerome Samavid  Williams III M.D   On: 04/11/2019 20:43    Procedures Procedures (including critical care time)  Medications Ordered in ED Medications  sodium chloride 0.9 % bolus 1,000 mL (0 mLs Intravenous Stopped 04/11/19 2210)  acetaminophen (TYLENOL) tablet 650 mg (650 mg Oral Given 04/11/19 2008)     Initial Impression / Assessment and Plan / ED Course  I have reviewed the triage vital signs and the nursing notes.  Pertinent labs & imaging results that were available during my care of the patient were reviewed by me and considered in my medical decision making (see chart for details).  Patient is nontoxic-appearing, no acute distress.  Low-grade fever 99.5 on arrival, slightly hypertensive 147/100, not hypoxic. Mucus membranes are dry.  Lungs are clear to auscultation in all fields.  Abdomen is nontender in all quadrants, no peritoneal signs.  DDX includes URI, pneumonia, COVID.  Will give PO Tylenol, IVF and reassess.  CBC and BMP are unremarkable.  Chest x-ray viewed by me without infiltrate.  Covid test is positive.  On reassessment patient reports feeling better after IV fluids, now afebrile.  Patient aware she will need to self quarantine and  continue with symptomatic treatment at home.  Patient is comfortable with above plan and is stable for discharge at this time. All questions were answered prior to disposition.  Strict return precautions for returning to the ED were discussed. Encouraged follow up with PCP. Provided patient with a list of clinic resources to use if they do not have a PCP. Instructed to call them today to arrange follow-up in the next 24-48 hours.   Brandy [name] was evaluated in Emergency Department on 04/11/2019 for the symptoms described in the history of present illness. She was evaluated in the context of the global COVID-19 pandemic, which necessitated consideration that the patient might be at risk for infection with the SARS-CoV-2 virus that causes COVID-19. Institutional protocols and algorithms that pertain to the evaluation of patients at risk for COVID-19 are in a state of rapid change based on information released by regulatory bodies including the CDC and federal and state organizations. These policies and algorithms were followed during the patient's care in the ED.   Final Clinical Impressions(s) / ED Diagnoses   Final diagnoses:  COVID-19    ED Discharge Orders    None       Kathyrn Lasslbrizze,  E, PA-C 04/11/19 2231    Gerhard MunchLockwood, Robert, MD 04/12/19 1402

## 2019-04-11 NOTE — Discharge Instructions (Addendum)
You have been seen today for cough . Please read and follow all provided instructions. Return to the emergency room for worsening condition or new concerning symptoms including worsening shortness of breath, fever you cannot control with Tylenol.  You tested positive for coronavirus today.  You will need to self quarantine for 2 weeks. If you are concerned about your oxygen levels you can buy a portable pulse ox.  This is the piece of equipment that you had on your finger while you are in the hospital.  -Your blood pressure was elevated today while you are in the emergency department.  It could be because you are not feeling well.  Recommend you have it rechecked within 1 week  1. Medications:  Continue symptomatic treatment.  You can take Tylenol as directed on the box for aches and pains.  You can continue taking over-the-counter cough medicine as you have been.  Continue usual home medications Take medications as prescribed. Please review all of the medicines and only take them if you do not have an allergy to them.   2. Treatment: rest, drink plenty of fluids.  Do your best to stay well-hydrated.  Even though you do not have an appetite it is important that you try to eat or at least drink plenty of fluids daily.  3. Follow Up: Please follow up with your primary doctor in 2-5 days for discussion of your diagnoses and further evaluation after today's visit; Call today to arrange your follow up.  If you do not have a primary care doctor use the resource guide provided to find one;   It is also a possibility that you have an allergic reaction to any of the medicines that you have been prescribed - Everybody reacts differently to medications and while MOST people have no trouble with most medicines, you may have a reaction such as nausea, vomiting, rash, swelling, shortness of breath. If this is the case, please stop taking the medicine immediately and contact your physician.  ?

## 2019-04-11 NOTE — ED Triage Notes (Signed)
Pt c/o cough, fever, generalized body aches and loss of taste since 7/6.

## 2019-04-21 ENCOUNTER — Telehealth: Payer: Medicaid Other | Admitting: Nurse Practitioner

## 2019-04-21 DIAGNOSIS — U071 COVID-19: Secondary | ICD-10-CM

## 2019-04-21 NOTE — Progress Notes (Signed)
Your test for COVID-19 was positive, on 04/11/19 meaning that you were infected with the novel coronavirus and could give the germ to others.    You have been enrolled in [facility] for COVID-19. Daily you will receive a questionnaire within the [place] website. Our COVID-19 response team will be monitoring your responses daily.  Please continue isolation at home, for at least 7 days since the start of your fever/cough/breathlessness and until you have had 3 consecutive days without fever (without taking a fever reducer) and with cough/breathlessness improving. Please continue good preventive care measures, including:  frequent hand-washing, avoid touching your face, cover coughs/sneezes, stay out of crowds and keep a 6 foot distance from others.  Recheck or go to the nearest hospital ED tent for re-assessment if fever/cough/breathlessness return.

## 2019-07-13 ENCOUNTER — Encounter: Payer: Self-pay | Admitting: Radiology

## 2019-11-04 ENCOUNTER — Other Ambulatory Visit (HOSPITAL_COMMUNITY)
Admission: RE | Admit: 2019-11-04 | Discharge: 2019-11-04 | Disposition: A | Discharge status: Home / Self Care | Payer: Medicaid Other | Source: Ambulatory Visit | Attending: Obstetrics & Gynecology | Admitting: Obstetrics & Gynecology

## 2019-11-04 ENCOUNTER — Ambulatory Visit (INDEPENDENT_AMBULATORY_CARE_PROVIDER_SITE_OTHER): Payer: Medicaid Other

## 2019-11-04 ENCOUNTER — Other Ambulatory Visit: Payer: Self-pay

## 2019-11-04 VITALS — BP 135/84 | HR 72

## 2019-11-04 DIAGNOSIS — Z113 Encounter for screening for infections with a predominantly sexual mode of transmission: Secondary | ICD-10-CM

## 2019-11-04 DIAGNOSIS — Z202 Contact with and (suspected) exposure to infections with a predominantly sexual mode of transmission: Secondary | ICD-10-CM

## 2019-11-04 NOTE — Progress Notes (Signed)
SUBJECTIVE:  24 y.o. female complains of  vaginal discharge for a couple of days. Denies abnormal vaginal bleeding or significant pelvic pain or fever. No UTI symptoms. Denies history of known exposure to STD.  No LMP recorded.  OBJECTIVE:  She appears well, afebrile. Urine dipstick: normal    ASSESSMENT:  Vaginal Discharge: none Vaginal Odor: none    PLAN:  GC, chlamydia,probe sent to lab. Treatment: To be determined once lab results are received. ROV prn if symptoms persist or worsen.

## 2019-11-05 LAB — CERVICOVAGINAL ANCILLARY ONLY
Chlamydia: NEGATIVE
Comment: NEGATIVE
Comment: NORMAL
Neisseria Gonorrhea: NEGATIVE

## 2020-05-30 ENCOUNTER — Ambulatory Visit: Payer: Medicaid Other | Attending: Critical Care Medicine

## 2020-09-15 ENCOUNTER — Emergency Department (HOSPITAL_COMMUNITY): Payer: Self-pay

## 2020-09-15 ENCOUNTER — Emergency Department (HOSPITAL_COMMUNITY)
Admission: EM | Admit: 2020-09-15 | Discharge: 2020-09-16 | Disposition: A | Discharge status: Home / Self Care | Payer: Self-pay | Attending: Emergency Medicine | Admitting: Emergency Medicine

## 2020-09-15 ENCOUNTER — Other Ambulatory Visit: Payer: Self-pay

## 2020-09-15 DIAGNOSIS — M25572 Pain in left ankle and joints of left foot: Secondary | ICD-10-CM | POA: Insufficient documentation

## 2020-09-15 DIAGNOSIS — Z5321 Procedure and treatment not carried out due to patient leaving prior to being seen by health care provider: Secondary | ICD-10-CM | POA: Insufficient documentation

## 2020-09-15 NOTE — ED Triage Notes (Signed)
Pt presents to ED POV. Pt c/o L ankle. Pt denies injury but reports she cannot bear or wear normal shoes w/o pain.

## 2020-09-16 NOTE — ED Notes (Signed)
Pt state "I am uncomfortable, I am going home." LWBS, moving OTF.

## 2020-10-10 ENCOUNTER — Ambulatory Visit: Admission: EM | Admit: 2020-10-10 | Discharge: 2020-10-10 | Discharge status: Against Medical Advice | Payer: Self-pay

## 2020-10-10 ENCOUNTER — Other Ambulatory Visit: Payer: Self-pay

## 2021-06-11 DIAGNOSIS — J4541 Moderate persistent asthma with (acute) exacerbation: Secondary | ICD-10-CM | POA: Insufficient documentation

## 2022-11-28 ENCOUNTER — Telehealth: Payer: Self-pay

## 2022-11-28 NOTE — Telephone Encounter (Signed)
Mychart msg sent

## 2023-01-07 ENCOUNTER — Emergency Department (HOSPITAL_COMMUNITY)
Admission: EM | Admit: 2023-01-07 | Discharge: 2023-01-07 | Disposition: A | Discharge status: Home / Self Care | Payer: Medicaid Other | Attending: Emergency Medicine | Admitting: Emergency Medicine

## 2023-01-07 ENCOUNTER — Emergency Department (HOSPITAL_COMMUNITY): Payer: Medicaid Other

## 2023-01-07 DIAGNOSIS — R0981 Nasal congestion: Secondary | ICD-10-CM | POA: Diagnosis not present

## 2023-01-07 DIAGNOSIS — R059 Cough, unspecified: Secondary | ICD-10-CM | POA: Diagnosis not present

## 2023-01-07 DIAGNOSIS — R062 Wheezing: Secondary | ICD-10-CM

## 2023-01-07 DIAGNOSIS — R0602 Shortness of breath: Secondary | ICD-10-CM | POA: Diagnosis not present

## 2023-01-07 MED ORDER — PREDNISONE 20 MG PO TABS: ORAL_TABLET | ORAL | 0 refills | Status: DC

## 2023-01-07 MED ORDER — BENZONATATE 100 MG PO CAPS: 100.0000 mg | ORAL_CAPSULE | Freq: Three times a day (TID) | ORAL | 0 refills | Status: DC

## 2023-01-07 MED ORDER — IPRATROPIUM-ALBUTEROL 0.5-2.5 (3) MG/3ML IN SOLN
3.0000 mL | Freq: Once | RESPIRATORY_TRACT | Status: AC
  Administered 2023-01-07: 3 mL via RESPIRATORY_TRACT
  Filled 2023-01-07: qty 3

## 2023-01-07 MED ORDER — PREDNISONE 20 MG PO TABS
60.0000 mg | ORAL_TABLET | Freq: Once | ORAL | Status: AC
  Administered 2023-01-07: 60 mg via ORAL
  Filled 2023-01-07: qty 3

## 2023-01-07 MED ORDER — ALBUTEROL SULFATE HFA 108 (90 BASE) MCG/ACT IN AERS: 1.0000 | INHALATION_SPRAY | Freq: Four times a day (QID) | RESPIRATORY_TRACT | 0 refills | Status: DC | PRN

## 2023-01-07 NOTE — ED Provider Notes (Signed)
[place] EMERGENCY DEPARTMENT AT [facility] Provider Note   CSN: [id number] Arrival date & time: 01/07/23  0356     History  Chief Complaint  Patient presents with   Shortness of Breath    Brandy [name] is a 27 y.o. female.  The history is provided by the patient and medical records.  Shortness of Breath  27 year old female presenting to the ED with shortness of breath.  She was diagnosed with bronchitis a month ago and has been having issues since.  She completed course of steroids and cough medicine but did not really have much improvement.  She has been using her inhaler sporadically and is only getting transient relief from this.  She has not had any fevers.  She is now out of her inhaler.  Home Medications Prior to Admission medications   Medication Sig Start Date End Date Taking? Authorizing Provider  albuterol (VENTOLIN HFA) 108 (90 Base) MCG/ACT inhaler Inhale 1-2 puffs into the lungs every 6 (six) hours as needed for wheezing. 01/07/23  Yes Garlon Hatchet, PA-C  benzonatate (TESSALON) 100 MG capsule Take 1 capsule (100 mg total) by mouth every 8 (eight) hours. 01/07/23  Yes Garlon Hatchet, PA-C  predniSONE (DELTASONE) 20 MG tablet Take 40 mg by mouth daily for 3 days, then 20mg  by mouth daily for 3 days, then 10mg  daily for 3 days 01/07/23  Yes Garlon Hatchet, PA-C  fluticasone [facility]) 50 MCG/ACT nasal spray Place 2 sprays into both nostrils daily. Patient not taking: Reported on 11/04/2019 12/17/18   Loren Racer, MD  ibuprofen (ADVIL,MOTRIN) 200 MG tablet Take 200 mg by mouth every 6 (six) hours as needed for headache.    [provider]  loratadine (CLARITIN) 10 MG tablet Take 1 tablet (10 mg total) by mouth daily. Patient not taking: Reported on 11/04/2019 12/17/18   Loren Racer, MD  meclizine (ANTIVERT) 25 MG tablet Take 1 tablet (25 mg total) by mouth 3 (three) times daily as needed for dizziness. Patient not taking: Reported on 11/04/2019  12/17/18   Loren Racer, MD  methocarbamol (ROBAXIN) 500 MG tablet Take 2 tablets (1,000 mg total) by mouth every 8 (eight) hours as needed for muscle spasms. Patient not taking: Reported on 11/04/2019 12/17/18   Loren Racer, MD      Allergies    Patient has no known allergies.    Review of Systems   Review of Systems  Respiratory:  Positive for shortness of breath.   All other systems reviewed and are negative.   Physical Exam Updated Vital Signs BP (!) 147/93 (BP Location: Right Wrist)   Pulse 98   Temp 98 F (36.7 C) (Oral)   Resp (!) 22   LMP 01/07/2023   SpO2 98%  Physical Exam Vitals and nursing note reviewed.  Constitutional:      Appearance: She is well-developed. She is obese.  HENT:     Head: Normocephalic and atraumatic.     Nose:     Comments: Sounds congested Eyes:     Conjunctiva/sclera: Conjunctivae normal.     Pupils: Pupils are equal, round, and reactive to light.  Cardiovascular:     Rate and Rhythm: Normal rate and regular rhythm.     Heart sounds: Normal heart sounds.  Pulmonary:     Effort: Pulmonary effort is normal.     Breath sounds: Wheezing present.     Comments: Diffuse expiratory wheezes, no acute distress, able to speak in sentences without difficulty  Abdominal:     General: Bowel sounds are normal.     Palpations: Abdomen is soft.  Musculoskeletal:        General: Normal range of motion.     Cervical back: Normal range of motion.  Skin:    General: Skin is warm and dry.  Neurological:     Mental Status: She is alert and oriented to person, place, and time.     ED Results / Procedures / Treatments   Labs (all labs ordered are listed, but only abnormal results are displayed) Labs Reviewed - No data to display  EKG None  Radiology DG Chest 2 View  Result Date: 01/07/2023 CLINICAL DATA:  Cough, shortness of breath. EXAM: CHEST - 2 VIEW COMPARISON:  04/19/2019. FINDINGS: The heart size and mediastinal contours are within  normal limits. Both lungs are clear. No acute osseous abnormality. IMPRESSION: No active cardiopulmonary disease. Electronically Signed   By: Thornell Sartorius M.D.   On: 01/07/2023 04:55    Procedures Procedures    Medications Ordered in ED Medications  ipratropium-albuterol (DUONEB) 0.5-2.5 (3) MG/3ML nebulizer solution 3 mL (3 mLs Nebulization Given 01/07/23 0537)  predniSONE (DELTASONE) tablet 60 mg (60 mg Oral Given 01/07/23 0352)    ED Course/ Medical Decision Making/ A&P                             Medical Decision Making Amount and/or Complexity of Data Reviewed Radiology: ordered. ECG/medicine tests: ordered and independent interpretation performed.  Risk Prescription drug management.   27 year old female here with shortness of breath.  Had bronchitis last month and has been having ongoing issues since.  She is now out of her home inhaler.  She is afebrile and nontoxic on exam.  She does have some expiratory wheezes but is in no acute distress, able to speak in full sentences with stable vital signs.  Chest x-ray is negative for any acute findings.  She continues to have some nasal congestion, suspect this is likely viral in nature.  She was given DuoNeb here along with prednisone, will continue taper for the next few days.  Inhaler refilled, tessalon for cough.  Encouraged to follow-up with PCP.  Can return here for new concerns.  Final Clinical Impression(s) / ED Diagnoses Final diagnoses:  None    Rx / DC Orders ED Discharge Orders          Ordered    albuterol (VENTOLIN HFA) 108 (90 Base) MCG/ACT inhaler  Every 6 hours PRN        01/07/23 0535    predniSONE (DELTASONE) 20 MG tablet        01/07/23 0535    benzonatate (TESSALON) 100 MG capsule  Every 8 hours        01/07/23 0535              Garlon Hatchet, PA-C 01/07/23 0604    Palumbo, April, MD 01/07/23 4818

## 2023-01-07 NOTE — ED Triage Notes (Signed)
Patient arrived stating she was diagnosed with bronchitis a month ago and has complaints of shortness of breath and ongoing cough. States using her inhaler with no relief.

## 2023-01-07 NOTE — Discharge Instructions (Signed)
Take the prescribed medication as directed.  Continue using inhaler as needed. Follow-up with your primary care doctor. Return to the ED for new or worsening symptoms.

## 2023-02-08 ENCOUNTER — Emergency Department (HOSPITAL_COMMUNITY): Payer: Medicaid Other

## 2023-02-08 ENCOUNTER — Emergency Department (HOSPITAL_COMMUNITY)
Admission: EM | Admit: 2023-02-08 | Discharge: 2023-02-08 | Disposition: A | Discharge status: Home / Self Care | Payer: Medicaid Other | Attending: Emergency Medicine | Admitting: Emergency Medicine

## 2023-02-08 ENCOUNTER — Other Ambulatory Visit: Payer: Self-pay

## 2023-02-08 ENCOUNTER — Encounter (HOSPITAL_COMMUNITY): Payer: Self-pay

## 2023-02-08 DIAGNOSIS — R590 Localized enlarged lymph nodes: Secondary | ICD-10-CM | POA: Diagnosis not present

## 2023-02-08 DIAGNOSIS — R0602 Shortness of breath: Secondary | ICD-10-CM | POA: Diagnosis not present

## 2023-02-08 DIAGNOSIS — R059 Cough, unspecified: Secondary | ICD-10-CM | POA: Diagnosis not present

## 2023-02-08 DIAGNOSIS — J4541 Moderate persistent asthma with (acute) exacerbation: Secondary | ICD-10-CM | POA: Insufficient documentation

## 2023-02-08 DIAGNOSIS — R051 Acute cough: Secondary | ICD-10-CM | POA: Diagnosis not present

## 2023-02-08 LAB — CBC WITH DIFFERENTIAL/PLATELET
Abs Immature Granulocytes: 0.03 10*3/uL (ref 0.00–0.07)
Basophils Absolute: 0.1 10*3/uL (ref 0.0–0.1)
Basophils Relative: 1 %
Eosinophils Absolute: 0.7 10*3/uL — ABNORMAL HIGH (ref 0.0–0.5)
Eosinophils Relative: 7 %
HCT: 34.8 % — ABNORMAL LOW (ref 36.0–46.0)
Hemoglobin: 10.4 g/dL — ABNORMAL LOW (ref 12.0–15.0)
Immature Granulocytes: 0 %
Lymphocytes Relative: 30 %
Lymphs Abs: 2.9 10*3/uL (ref 0.7–4.0)
MCH: 23.1 pg — ABNORMAL LOW (ref 26.0–34.0)
MCHC: 29.9 g/dL — ABNORMAL LOW (ref 30.0–36.0)
MCV: 77.3 fL — ABNORMAL LOW (ref 80.0–100.0)
Monocytes Absolute: 0.6 10*3/uL (ref 0.1–1.0)
Monocytes Relative: 7 %
Neutro Abs: 5.2 10*3/uL (ref 1.7–7.7)
Neutrophils Relative %: 55 %
Platelets: 464 10*3/uL — ABNORMAL HIGH (ref 150–400)
RBC: 4.5 MIL/uL (ref 3.87–5.11)
RDW: 15.3 % (ref 11.5–15.5)
WBC: 9.5 10*3/uL (ref 4.0–10.5)
nRBC: 0 % (ref 0.0–0.2)

## 2023-02-08 LAB — TROPONIN I (HIGH SENSITIVITY): Troponin I (High Sensitivity): <2 ng/L (ref <18)

## 2023-02-08 LAB — D-DIMER, QUANTITATIVE: D-Dimer, Quant: 0.99 ug/mL-FEU — ABNORMAL HIGH (ref 0.00–0.50)

## 2023-02-08 LAB — BASIC METABOLIC PANEL
Anion gap: 6 (ref 5–15)
BUN: 9 mg/dL (ref 6–20)
CO2: 22 mmol/L (ref 22–32)
Calcium: 8 mg/dL — ABNORMAL LOW (ref 8.9–10.3)
Chloride: 105 mmol/L (ref 98–111)
Creatinine, Ser: 0.55 mg/dL (ref 0.44–1.00)
GFR, Estimated: >60 mL/min (ref >60)
Glucose, Bld: 128 mg/dL — ABNORMAL HIGH (ref 70–99)
Potassium: 3.5 mmol/L (ref 3.5–5.1)
Sodium: 133 mmol/L — ABNORMAL LOW (ref 135–145)

## 2023-02-08 LAB — PREGNANCY, URINE: Preg Test, Ur: NEGATIVE

## 2023-02-08 MED ORDER — IPRATROPIUM-ALBUTEROL 0.5-2.5 (3) MG/3ML IN SOLN: 3.0000 mL | Freq: Four times a day (QID) | RESPIRATORY_TRACT | 3 refills | Status: DC | PRN

## 2023-02-08 MED ORDER — METHYLPREDNISOLONE SODIUM SUCC 125 MG IJ SOLR
125.0000 mg | Freq: Once | INTRAMUSCULAR | Status: AC
  Administered 2023-02-08: 125 mg via INTRAVENOUS
  Filled 2023-02-08: qty 2

## 2023-02-08 MED ORDER — ALBUTEROL SULFATE HFA 108 (90 BASE) MCG/ACT IN AERS: 1.0000 | INHALATION_SPRAY | Freq: Four times a day (QID) | RESPIRATORY_TRACT | 3 refills | Status: DC | PRN

## 2023-02-08 MED ORDER — LORATADINE 10 MG PO TABS: 10.0000 mg | ORAL_TABLET | Freq: Every day | ORAL | 0 refills | Status: DC

## 2023-02-08 MED ORDER — PREDNISONE 10 MG (21) PO TBPK: ORAL_TABLET | Freq: Every day | ORAL | 0 refills | Status: DC

## 2023-02-08 MED ORDER — IPRATROPIUM-ALBUTEROL 0.5-2.5 (3) MG/3ML IN SOLN
3.0000 mL | Freq: Once | RESPIRATORY_TRACT | Status: AC
Start: 2023-02-08 — End: 2023-02-08
  Administered 2023-02-08: 3 mL via RESPIRATORY_TRACT
  Filled 2023-02-08: qty 3

## 2023-02-08 MED ORDER — FLUTICASONE PROPIONATE 50 MCG/ACT NA SUSP: 2.0000 | Freq: Every day | NASAL | 0 refills | Status: DC

## 2023-02-08 MED ORDER — IPRATROPIUM-ALBUTEROL 0.5-2.5 (3) MG/3ML IN SOLN
3.0000 mL | Freq: Once | RESPIRATORY_TRACT | Status: AC
  Administered 2023-02-08: 3 mL via RESPIRATORY_TRACT
  Filled 2023-02-08: qty 3

## 2023-02-08 MED ORDER — IOHEXOL 350 MG/ML SOLN
100.0000 mL | Freq: Once | INTRAVENOUS | Status: AC | PRN
  Administered 2023-02-08: 100 mL via INTRAVENOUS

## 2023-02-08 NOTE — ED Provider Notes (Signed)
[place] EMERGENCY DEPARTMENT AT [facility] Provider Note   CSN: [id number] Arrival date & time: 02/08/23  1242     History  Chief Complaint  Patient presents with   Shortness of Breath    Brandy [name] is a 27 y.o. female with history significant for asthma here for evaluation of shortness of breath and chest tightness.  Was diagnosed with bronchitis approximately 2 months ago.  Received a course of steroids, did not help was seen in the emergency department approxi-1 month ago did a 2-week course of steroids and is still having shortness of breath and wheeze.  She does through an albuterol inhaler in less than 1 month.  She has albuterol nebulizers at home.  She had some minimal improvement on the 2-week course of steroids however as soon as stopping this medication she continued to have chest tightness.  She does travel extensively for work.  She does not have a PCP, allergist or pulmonary doctor.  Her cough is productive of clear and yellow sputum.  Does occasionally have some posttussive emesis.  Chest feels tight, cannot take a deep breath.  No pain or swelling to lower legs.  She is on daily antihistamine for seasonal allergies.  Has some chronic rhinorrhea.  No fever, back pain, sore throat, no exertional chest pain, abdominal pain.  No PND, orthopnea, history of CHF.  HPI     Home Medications Prior to Admission medications   Medication Sig Start Date End Date Taking? Authorizing Provider  albuterol (VENTOLIN HFA) 108 (90 Base) MCG/ACT inhaler Inhale 1-2 puffs into the lungs every 6 (six) hours as needed for wheezing or shortness of breath. 02/08/23  Yes Maelie Chriswell A, PA-C  cetirizine (ZYRTEC) 10 MG tablet Take 10 mg by mouth daily.   Yes [provider]  ipratropium-albuterol (DUONEB) 0.5-2.5 (3) MG/3ML SOLN Take 3 mLs by nebulization every 6 (six) hours as needed. 02/08/23  Yes Yu Cragun A, PA-C  predniSONE (STERAPRED UNI-PAK 21 TAB) 10 MG (21)  TBPK tablet Take by mouth daily. Take 6 tabs by mouth daily  for 2 days, then 5 tabs for 2 days, then 4 tabs for 2 days, then 3 tabs for 2 days, 2 tabs for 2 days, then 1 tab by mouth daily for 2 days 02/08/23  Yes Sanam Marmo A, PA-C  fluticasone (FLONASE) 50 MCG/ACT nasal spray Place 2 sprays into both nostrils daily. 02/08/23   Conchita Truxillo A, PA-C  loratadine (CLARITIN) 10 MG tablet Take 1 tablet (10 mg total) by mouth daily. 02/08/23   Siddarth Hsiung A, PA-C      Allergies    Patient has no known allergies.    Review of Systems   Review of Systems  Constitutional: Negative.   HENT:  Positive for congestion and rhinorrhea.   Respiratory:  Positive for cough, chest tightness, shortness of breath and wheezing.   Cardiovascular: Negative.   Gastrointestinal: Negative.   Genitourinary: Negative.   Musculoskeletal: Negative.   Skin: Negative.   Neurological: Negative.   All other systems reviewed and are negative.   Physical Exam Updated Vital Signs BP 132/86   Pulse 94   Temp 98.5 F (36.9 C) (Oral)   Resp 19   Wt 106 kg   SpO2 98%   BMI 38.89 kg/m  Physical Exam Vitals and nursing note reviewed.  Constitutional:      General: She is not in acute distress.    Appearance: She is well-developed. She is obese. She  is not ill-appearing, toxic-appearing or diaphoretic.  HENT:     Head: Normocephalic and atraumatic.  Eyes:     Pupils: Pupils are equal, round, and reactive to light.  Cardiovascular:     Rate and Rhythm: Tachycardia present.     Pulses: Normal pulses.     Heart sounds: Normal heart sounds.     Comments: HR 102 in room Pulmonary:     Effort: No respiratory distress.     Breath sounds: Decreased breath sounds and wheezing present.     Comments: Overall decreased air movement, mild expiratory wheeze at bases Chest:     Comments: Nontender chest wall Abdominal:     General: Bowel sounds are normal. There is no distension.     Palpations: Abdomen is  soft.     Tenderness: There is no abdominal tenderness.     Comments: Soft, nontender  Musculoskeletal:        General: Normal range of motion.     Cervical back: Normal range of motion.  Skin:    General: Skin is warm and dry.  Neurological:     General: No focal deficit present.     Mental Status: She is alert.  Psychiatric:        Mood and Affect: Mood normal.     ED Results / Procedures / Treatments   Labs (all labs ordered are listed, but only abnormal results are displayed) Labs Reviewed  CBC WITH DIFFERENTIAL/PLATELET - Abnormal; Notable for the following components:      Result Value   Hemoglobin 10.4 (*)    HCT 34.8 (*)    MCV 77.3 (*)    MCH 23.1 (*)    MCHC 29.9 (*)    Platelets 464 (*)    Eosinophils Absolute 0.7 (*)    All other components within normal limits  BASIC METABOLIC PANEL - Abnormal; Notable for the following components:   Sodium 133 (*)    Glucose, Bld 128 (*)    Calcium 8.0 (*)    All other components within normal limits  D-DIMER, QUANTITATIVE - Abnormal; Notable for the following components:   D-Dimer, Quant 0.99 (*)    All other components within normal limits  PREGNANCY, URINE  TROPONIN I (HIGH SENSITIVITY)    EKG EKG Interpretation  Date/Time:  Saturday Feb 08 2023 13:22:09 EDT Ventricular Rate:  83 PR Interval:  156 QRS Duration: 92 QT Interval:  389 QTC Calculation: 458 R Axis:   -1 Text Interpretation: Sinus rhythm Confirmed by Kristine Royal [phone]) on 02/08/2023 1:39:47 PM  Radiology CT Angio Chest PE W and/or Wo Contrast  Result Date: 02/08/2023 CLINICAL DATA:  Shortness of breath and cough. EXAM: CT ANGIOGRAPHY CHEST WITH CONTRAST TECHNIQUE: Multidetector CT imaging of the chest was performed using the standard protocol during bolus administration of intravenous contrast. Multiplanar CT image reconstructions and MIPs were obtained to evaluate the vascular anatomy. RADIATION DOSE REDUCTION: This exam was performed according  to the departmental dose-optimization program which includes automated exposure control, adjustment of the mA and/or kV according to patient size and/or use of iterative reconstruction technique. CONTRAST:  OMNIPAQUE IOHEXOL 350 MG/ML SOLN COMPARISON:  No comparison studies available. FINDINGS: Cardiovascular: The heart size is normal. No substantial pericardial effusion. No thoracic aortic aneurysm. No substantial atherosclerosis of the thoracic aorta. There is no filling defect within the opacified pulmonary arteries to suggest the presence of an acute pulmonary embolus. Mediastinum/Nodes: No mediastinal lymphadenopathy. There is no hilar lymphadenopathy. The esophagus has normal  imaging features. 11 mm short axis left axillary lymph node is upper normal to borderline increased in size. Given patient age, the entire left axilla is not been included in the field of view for this study. Lungs/Pleura: No suspicious pulmonary nodule or mass. No focal airspace consolidation. There is no evidence of pleural effusion. Upper Abdomen: Visualized portion of the upper abdomen is unremarkable. Musculoskeletal: No worrisome lytic or sclerotic osseous abnormality. Review of the MIP images confirms the above findings. IMPRESSION: 1. No CT evidence for acute pulmonary embolus. 2. No acute findings in the chest. 3. 11 mm short axis left axillary lymph node is upper normal to borderline increased in size. The entire left axilla is not been included in the field of view for this study. Given patient age, this is almost assuredly benign. Clinical correlation suggested. This may be amenable to clinical inspection. Electronically Signed   By: Kennith Center M.D.   On: 02/08/2023 16:03   DG Chest 2 View  Result Date: 02/08/2023 CLINICAL DATA:  Shortness of breath for 4 months with cough. EXAM: CHEST - 2 VIEW COMPARISON:  Chest radiograph 01/07/2023 FINDINGS: The cardiomediastinal contours are within normal limits. The lungs are  clear. No pneumothorax or pleural effusion. No acute finding in the visualized skeleton. IMPRESSION: No acute cardiopulmonary finding. Electronically Signed   By: Emmaline Kluver M.D.   On: 02/08/2023 13:57    Procedures Procedures    Medications Ordered in ED Medications  ipratropium-albuterol (DUONEB) 0.5-2.5 (3) MG/3ML nebulizer solution 3 mL (3 mLs Nebulization Given 02/08/23 1326)  methylPREDNISolone sodium succinate (SOLU-MEDROL) 125 mg/2 mL injection 125 mg (125 mg Intravenous Given 02/08/23 1326)  ipratropium-albuterol (DUONEB) 0.5-2.5 (3) MG/3ML nebulizer solution 3 mL (3 mLs Nebulization Given 02/08/23 1457)  iohexol (OMNIPAQUE) 350 MG/ML injection 100 mL (100 mLs Intravenous Contrast Given 02/08/23 1524)   ED Course/ Medical Decision Making/ A&P   27 year old here for evaluation of chest tightness, shortness of breath and cough which has been ongoing over the last few months.  She travels extensively for work.  Has done 2 courses of steroids which helped intermittently however upon stopping them her symptoms returned.  Feels like she cannot take a deep breath.  She uses albuterol nebulizers and as needed albuterol inhaler as well as Symbicort at home.  She is not currently followed by PCP or pulmonary or asthma.  She is no clinical evidence of VTE.  Cannot PERC due to extensive travel and OCP.  Clinically she has decreased overall breath sounds and some wheeze at the bases.  Will treat for asthma exacerbation the meantime.  Symptoms do not seem consistent with heart failure, unstable angina, ACS.  She also does admit to some chronic reflux however has no abdominal pain.  She is not currently on any medication for this.  Will plan on labs, imaging and reassess  Labs and imaging personally viewed and interpreted:  EKG without ischemic changes CBC without leukocytosis, hemoglobin 10.4 Metabolic panel sodium 133, glucose 128 D-dimer 0.99, will obtain CT scan Delta troponin flat Pregnancy  test negative Chest x-ray without pneumonia, pneumothorax, infiltrates CTA negative for PE, infection, edema.  Does show borderline axillary lymphadenopathy.  Patient reassessed.  Still has some mild wheeze, will give additional DuoNeb.  Will reassess  Patient reassessed.  Significant improvement in breath sounds.  She has no hypoxia with ambulation, no tachycardia.  She is normotensive.  I discussed with patient, mother will write for DuoNebs at home, longer course of steroids.  She  is encouraged to establish care with an allergy or asthma specialist or pulmonology given her recurrent symptoms as well as PCP.  I also discussed her axilla lymphadenopathy.  Discussed keep close monitor with this with her PCP.  I did add on PPI to her regimen given she does have reflux, cough could possibly due reflux in nature.  Low suspicion for bacterial infectious process given reassuring CT scan.  Will also add on Flonase for her chronic rhinorrhea  Overall I suspect her symptoms could possibly be multifactorial, reflux versus asthma versus allergy in nature  Do not feel she needs additional labs, imaging, hospitalization at this time.  The patient has been appropriately medically screened and/or stabilized in the ED. I have low suspicion for any other emergent medical condition which would require further screening, evaluation or treatment in the ED or require inpatient management.  Patient is hemodynamically stable and in no acute distress.  Patient able to ambulate in department prior to ED.  Evaluation does not show acute pathology that would require ongoing or additional emergent interventions while in the emergency department or further inpatient treatment.  I have discussed the diagnosis with the patient and answered all questions.  Pain is been managed while in the emergency department and patient has no further complaints prior to discharge.  Patient is comfortable with plan discussed in room and is stable  for discharge at this time.  I have discussed strict return precautions for returning to the emergency department.  Patient was encouraged to follow-up with PCP/specialist refer to at discharge.                            Medical Decision Making Amount and/or Complexity of Data Reviewed Independent Historian: parent External Data Reviewed: labs, radiology, ECG and notes. Labs: ordered. Decision-making details documented in ED Course. Radiology: ordered and independent interpretation performed. Decision-making details documented in ED Course. ECG/medicine tests: ordered and independent interpretation performed. Decision-making details documented in ED Course.  Risk OTC drugs. Prescription drug management. Parenteral controlled substances. Decision regarding hospitalization. Diagnosis or treatment significantly limited by social determinants of health.          Final Clinical Impression(s) / ED Diagnoses Final diagnoses:  Moderate persistent asthma with exacerbation  Acute cough  Axillary lymphadenopathy    Rx / DC Orders ED Discharge Orders          Ordered    ipratropium-albuterol (DUONEB) 0.5-2.5 (3) MG/3ML SOLN  Every 6 hours PRN        02/08/23 1656    albuterol (VENTOLIN HFA) 108 (90 Base) MCG/ACT inhaler  Every 6 hours PRN        02/08/23 1656    predniSONE (STERAPRED UNI-PAK 21 TAB) 10 MG (21) TBPK tablet  Daily        02/08/23 1656    fluticasone (FLONASE) 50 MCG/ACT nasal spray  Daily        02/08/23 1656    loratadine (CLARITIN) 10 MG tablet  Daily        02/08/23 1656              Ayame Rena A, PA-C 02/08/23 1706    Wynetta Fines, MD 02/09/23 [phone]

## 2023-02-08 NOTE — Discharge Instructions (Addendum)
It was a pleasure taking care of you here in the emergency department today  As discussed this is likely an asthma exacerbation versus reflux versus seasonal allergy flare.  We discharged on a few medications.  Make sure to establish care with a primary care provider or an allergy/asthma specialist or pulmonology  Make sure to follow-up outpatient for close follow-up of your axillary lymph node  Return for new or worsening symptoms

## 2023-02-08 NOTE — ED Triage Notes (Signed)
C/o sob and cough that is worse in the mornings and that returned after finishing steroids 2 weeks ago. Pt reports vomiting from coughing so hard.  Home inhaler w/o relief.   Pt talking in full sentences.  Denies cp  Tessalon w/o relief.

## 2023-03-27 ENCOUNTER — Telehealth: Payer: Medicaid Other | Admitting: Physician Assistant

## 2023-03-27 DIAGNOSIS — J4541 Moderate persistent asthma with (acute) exacerbation: Secondary | ICD-10-CM | POA: Diagnosis not present

## 2023-03-27 MED ORDER — PREDNISONE 50 MG PO TABS
50.0000 mg | ORAL_TABLET | Freq: Every day | ORAL | 0 refills | Status: DC
Start: 2023-03-27 — End: 2024-01-07

## 2023-03-27 MED ORDER — BUDESONIDE-FORMOTEROL FUMARATE 160-4.5 MCG/ACT IN AERO: 1.0000 | INHALATION_SPRAY | Freq: Two times a day (BID) | RESPIRATORY_TRACT | 0 refills | Status: DC

## 2023-03-27 NOTE — Progress Notes (Signed)
E-Visit for Asthma  Based on what you have shared with me, it looks like you may have a flare up of your asthma.  Asthma is a chronic (ongoing) lung disease which results in airway obstruction, inflammation and hyper-responsiveness.   Asthma symptoms vary from person to person, with common symptoms including nighttime awakening and decreased ability to participate in normal activities as a result of shortness of breath. It is often triggered by changes in weather, changes in the season, changes in air temperature, or inside (home, school, daycare or work) allergens such as animal dander, mold, mildew, woodstoves or cockroaches.   It can also be triggered by hormonal changes, extreme emotion, physical exertion or an upper respiratory tract illness.     It is important to identify the trigger, and then eliminate or avoid the trigger if possible.   If you have been prescribed medications to be taken on a regular basis, it is important to follow the asthma action plan and to follow guidelines to adjust medication in response to increasing symptoms of decreased peak expiratory flow rate  Treatment: I have prescribed: Prednisone 50mg  daily for 7 days I have also prescribed an inhaler called a maintenance inhaler. This is an inhaler that is used every day regardless of symptoms to prevent these exacerbations and reduce the need of the rescue (Albuterol) inhaler. I am prescribing Symbicort Inhale 1 puff twice daily. You will need to follow up with your Primary Care Provider, or establish with one for follow up of your asthma to get better control. Each exacerbation can cause scarring to your lungs and continue to make breathing worse. It is why is is imperative to have follow ups with a provider to manage your asthma to prevent exacerbations the best we can.   HOME CARE Only take medications as  instructed by your medical team. Consider wearing a mask or scarf to improve breathing air temperature have been shown to decrease irritation and decrease exacerbations Get rest. Taking a steamy shower or using a humidifier may help nasal congestion sand ease sore throat pain. You can place a towel over your head and breathe in the steam from hot water coming from a faucet. Using a saline nasal spray works much the same way.  Cough drops, hare candies and sore throat lozenges may ease your cough.  Avoid close contacts especially the very you and the elderly Cover your mouth if you cough or sneeze Always remember to wash your hands.    GET HELP RIGHT AWAY IF: You develop worsening symptoms; breathlessness at rest, drowsy, confused or agitated, unable to speak in full sentences You have coughing fits You develop a severe headache or visual changes You develop shortness of breath, difficulty breathing or start having chest pain Your symptoms persist after you have completed your treatment plan If your symptoms do not improve within 10 days  MAKE SURE YOU Understand these instructions. Will watch your condition. Will get help right away if you are not doing well or get worse.   Your e-visit answers were reviewed by a board certified advanced clinical practitioner to complete your personal care plan, Depending upon the condition, your plan could have included both over the counter or prescription medications.   Please review your pharmacy choice. Your safety is important to [place]. If you have drug allergies check your prescription carefully.  You can use MyChart to ask questions about today's visit, request a non-urgent  call back, or ask for a work or school excuse  for 24 hours related to this e-Visit. If it has been greater than 24 hours you will need to follow up with your provider, or enter a new e-Visit to address those concerns.   You will get an e-mail in the next two days asking about  your experience. I hope that your e-visit has been valuable and will speed your recovery. Thank you for using e-visits.  I have spent 5 minutes in review of e-visit questionnaire, review and updating patient chart, medical decision making and response to patient.   Margaretann Loveless, PA-C

## 2023-08-10 ENCOUNTER — Telehealth: Payer: Medicaid Other | Admitting: Nurse Practitioner

## 2023-08-10 DIAGNOSIS — J4541 Moderate persistent asthma with (acute) exacerbation: Secondary | ICD-10-CM | POA: Diagnosis not present

## 2023-08-10 MED ORDER — SYMBICORT 160-4.5 MCG/ACT IN AERO: 2.0000 | INHALATION_SPRAY | Freq: Two times a day (BID) | RESPIRATORY_TRACT | 0 refills | Status: DC

## 2023-08-10 MED ORDER — ALBUTEROL SULFATE HFA 108 (90 BASE) MCG/ACT IN AERS: 1.0000 | INHALATION_SPRAY | Freq: Four times a day (QID) | RESPIRATORY_TRACT | 0 refills | Status: DC | PRN

## 2023-08-10 NOTE — Progress Notes (Signed)
If you or a family member do not have a primary care provider, use the link below to schedule a visit and establish care. When you choose a [facility] primary care physician or advanced practice provider, you gain a long-term partner in health. Find a Primary Care Provider   [email or web address]                                                                                                                     E-Visit for Asthma   Based on what you have shared with me, it looks like you may have a flare up of your asthma.  Asthma is a chronic (ongoing) lung disease which results in airway obstruction, inflammation and hyper-responsiveness.    Asthma symptoms vary from person to person, with common symptoms including nighttime awakening and decreased ability to participate in normal activities as a result of shortness of breath. It is often triggered by changes in weather, changes in the season, changes in air temperature, or inside (home, school, daycare or work) allergens such as animal dander, mold, mildew, woodstoves or cockroaches.   It can also be triggered by hormonal changes, extreme emotion, physical exertion or an upper respiratory tract illness.      It is important to identify the trigger, and then eliminate or avoid the trigger if possible.    If you have been prescribed medications to be taken on a regular basis, it is important to follow the asthma action plan and to follow guidelines to adjust medication in response to increasing symptoms of decreased peak expiratory flow rate   Treatment: I have prescribed: Albuterol (Proventil HFA; Ventolin HFA) 108 (90 Base) MCG/ACT Inhaler 2 puffs into the lungs every six hours as needed for wheezing or shortness of breath I have also refilled your symbicort.   HOME CARE Only take medications as instructed by your medical team. Consider  wearing a mask or scarf to improve breathing air temperature have been shown to decrease irritation and decrease exacerbations Get rest. Taking a steamy shower or using a humidifier may help nasal congestion sand ease sore throat pain. You can place a towel over your head and breathe in the steam from hot water coming from a faucet. Using a saline nasal spray works much the same way.  Cough drops, hare candies and sore throat lozenges may ease your cough.  Avoid close contacts especially the very you and the elderly Cover your mouth if you cough or sneeze Always remember to wash your hands.      GET HELP RIGHT AWAY IF: You develop worsening symptoms; breathlessness at rest, drowsy, confused or agitated, unable to speak in full sentences You have coughing fits You develop a severe headache or visual changes You develop shortness of breath, difficulty breathing or start having chest pain Your symptoms persist after you have completed your treatment plan If your symptoms do not improve within 10 days   MAKE SURE YOU Understand these instructions. Will watch your condition. Will get  help right away if you are not doing well or get worse.    Your e-visit answers were reviewed by a board certified advanced clinical practitioner to complete your personal care plan, Depending upon the condition, your plan could have included both over the counter or prescription medications.    Please review your pharmacy choice. Your safety is important to [place]. If you have drug allergies check your prescription carefully.   You can use MyChart to ask questions about today's visit, request a non-urgent  call back, or ask for a work or school excuse for 24 hours related to this e-Visit. If it has been greater than 24 hours you will need to follow up with your provider, or enter a new e-Visit to address those concerns.    You will get an e-mail in the next two days asking about your experience. I hope that your  e-visit has been valuable and will speed your recovery. Thank you for using e-visits.

## 2023-08-10 NOTE — Progress Notes (Signed)
I have spent 5 minutes in review of e-visit questionnaire, review and updating patient chart, medical decision making and response to patient.  ° °Jerrell Mangel W Secilia Apps, NP ° °  °

## 2023-09-03 ENCOUNTER — Other Ambulatory Visit: Payer: Self-pay | Admitting: Nurse Practitioner

## 2023-09-03 DIAGNOSIS — J4541 Moderate persistent asthma with (acute) exacerbation: Secondary | ICD-10-CM

## 2023-11-07 ENCOUNTER — Other Ambulatory Visit: Payer: Self-pay | Admitting: Nurse Practitioner

## 2023-11-07 DIAGNOSIS — J4541 Moderate persistent asthma with (acute) exacerbation: Secondary | ICD-10-CM

## 2023-11-07 NOTE — Telephone Encounter (Signed)
 Requested medication (s) are due for refill today: na   Requested medication (s) are on the active medication list: yes   Last refill:  08/10/23 #30.6 g 0 refills  Future visit scheduled: no   Notes to clinic:  non established patient . Last refill by Z. Fleming,NP. Do you want to refill Rx?      Requested Prescriptions  Pending Prescriptions Disp Refills   SYMBICORT  160-4.5 MCG/ACT inhaler [Pharmacy Med Name: SYMBICORT  160/4. (120 ORAL INH)] 30.6 g 0    Sig: INHALE 2 PUFFS INTO THE LUNGS TWICE DAILY     Pulmonology:  Combination Products Failed - 11/07/2023  9:05 AM      Failed - Valid encounter within last 12 months    Recent Outpatient Visits   None

## 2023-12-26 ENCOUNTER — Other Ambulatory Visit: Payer: Self-pay

## 2023-12-26 ENCOUNTER — Encounter (HOSPITAL_COMMUNITY): Payer: Self-pay

## 2023-12-26 ENCOUNTER — Emergency Department (HOSPITAL_COMMUNITY)
Admission: EM | Admit: 2023-12-26 | Discharge: 2023-12-26 | Disposition: A | Discharge status: Home / Self Care | Payer: Self-pay | Attending: Emergency Medicine | Admitting: Emergency Medicine

## 2023-12-26 DIAGNOSIS — R0981 Nasal congestion: Secondary | ICD-10-CM | POA: Insufficient documentation

## 2023-12-26 DIAGNOSIS — R0602 Shortness of breath: Secondary | ICD-10-CM | POA: Insufficient documentation

## 2023-12-26 DIAGNOSIS — J4541 Moderate persistent asthma with (acute) exacerbation: Secondary | ICD-10-CM

## 2023-12-26 DIAGNOSIS — Z7951 Long term (current) use of inhaled steroids: Secondary | ICD-10-CM | POA: Insufficient documentation

## 2023-12-26 DIAGNOSIS — J45909 Unspecified asthma, uncomplicated: Secondary | ICD-10-CM | POA: Insufficient documentation

## 2023-12-26 MED ORDER — BUDESONIDE-FORMOTEROL FUMARATE 160-4.5 MCG/ACT IN AERO: 2.0000 | INHALATION_SPRAY | Freq: Two times a day (BID) | RESPIRATORY_TRACT | 0 refills | Status: DC

## 2023-12-26 NOTE — ED Triage Notes (Signed)
 Patient presented to ER for shortness of breath/congestion. Patient states she has albuterol inhalers/nebulizer that haven't been working to reliever her asthma symptoms. Patient states she has been congested for over a month, has been on steroid tapers a couple times which helped but the congestion came back as soon as taper was finished.

## 2023-12-26 NOTE — Discharge Instructions (Addendum)
 As discussed, please establish care with a primary care provider as soon as possible for further care.  I have included several PCP offices below, please call one of your choice and schedule an appointment.  Your blood pressure was elevated here at 183/138.  Please continue monitoring this at home and bring a log of your blood pressures to your PCP appointment. Your PCP needs to recheck your blood pressure within the next month and can discuss further management with you.  Please make an appointment with the pulmonologist listed below to follow-up on your symptoms of shortness of breath.  This could be asthma, but there are also other things that could be causing the shortness of breath.  Your Symbicort inhaler has been refilled.  Please use this as prescribed.  Please only use your albuterol inhaler as needed, this should not be used daily.  You may use 2 puffs of Flonase (fluticasone) in each nostril daily for nasal congestion. Please use this consistently to help with your symptoms. This is an over-the-counter medication.   You may also try 1 puff of Afrin in each nostril for UP TO 3 DAYS. DO NOT USE FOR LONGER THAN 3 DAYS as this medication can cause your congestion to come back worse.  This is an over-the-counter medication.   You may continue using  Zyrtec daily to help with your allergy symptoms.  Return to the ER for any emergent concerns.

## 2023-12-26 NOTE — ED Provider Notes (Signed)
 [place] EMERGENCY DEPARTMENT AT [facility] Provider Note   CSN: [id number] Arrival date & time: 12/26/23  1207     History  Chief Complaint  Patient presents with   Shortness of Breath    Brandy [name] is a 28 y.o. female with self-reported history of asthma, presents with concern for ongoing asthma symptoms for the past year.  States she has been using her albuterol inhaler daily without improvement in symptoms.  States she wakes up with wheezing and feeling short of breath.  She also reports nasal congestion has been ongoing for the past couple weeks.  She denies any cough, fevers, chills.  She has been trying to use Claritin and Zyrtec for her symptoms without relief.   Shortness of Breath      Home Medications Prior to Admission medications   Medication Sig Start Date End Date Taking? Authorizing Provider  albuterol (VENTOLIN HFA) 108 (90 Base) MCG/ACT inhaler Inhale 1-2 puffs into the lungs every 6 (six) hours as needed for wheezing or shortness of breath. 08/10/23   Claiborne Rigg, NP  budesonide-formoterol [facility]) 160-4.5 MCG/ACT inhaler Inhale 2 puffs into the lungs 2 (two) times daily. 12/26/23   Arabella Merles, PA-C  cetirizine (ZYRTEC) 10 MG tablet Take 10 mg by mouth daily.    [provider]  fluticasone (FLONASE) 50 MCG/ACT nasal spray Place 2 sprays into both nostrils daily. 02/08/23   Henderly, Britni A, PA-C  ipratropium-albuterol (DUONEB) 0.5-2.5 (3) MG/3ML SOLN Take 3 mLs by nebulization every 6 (six) hours as needed. 02/08/23   Henderly, Britni A, PA-C  loratadine (CLARITIN) 10 MG tablet Take 1 tablet (10 mg total) by mouth daily. 02/08/23   Henderly, Britni A, PA-C  predniSONE (DELTASONE) 50 MG tablet Take 1 tablet (50 mg total) by mouth daily with breakfast. 03/27/23   Margaretann Loveless, PA-C      Allergies    Patient has no known allergies.    Review of Systems   Review of Systems  Respiratory:  Positive for shortness of  breath.     Physical Exam Updated Vital Signs BP (!) 183/138 (BP Location: Left Arm)   Pulse 94   Temp 98.9 F (37.2 C) (Oral)   Resp 20   Ht 5\' 6"  (1.676 m)   Wt 131.5 kg   SpO2 98%   BMI 46.81 kg/m  Physical Exam Vitals and nursing note reviewed.  Constitutional:      General: She is not in acute distress.    Appearance: She is well-developed. She is obese.  HENT:     Head: Normocephalic and atraumatic.     Nose: Congestion present.     Mouth/Throat:     Pharynx: No oropharyngeal exudate or posterior oropharyngeal erythema.  Eyes:     Conjunctiva/sclera: Conjunctivae normal.  Cardiovascular:     Rate and Rhythm: Normal rate and regular rhythm.     Heart sounds: No murmur heard. Pulmonary:     Effort: Pulmonary effort is normal. No respiratory distress.     Breath sounds: Normal breath sounds.     Comments: Lungs clear to auscultation bilaterally, decreased breath sounds due to body habitus   Talking in full sentences on room air without difficulty Abdominal:     Palpations: Abdomen is soft.     Tenderness: There is no abdominal tenderness.  Musculoskeletal:        General: No swelling.     Cervical back: Neck supple.  Skin:    General:  Skin is warm and dry.     Capillary Refill: Capillary refill takes less than 2 seconds.  Neurological:     Mental Status: She is alert.  Psychiatric:        Mood and Affect: Mood normal.     ED Results / Procedures / Treatments   Labs (all labs ordered are listed, but only abnormal results are displayed) Labs Reviewed - No data to display  EKG None  Radiology No results found.  Procedures Procedures    Medications Ordered in ED Medications - No data to display  ED Course/ Medical Decision Making/ A&P                                 Medical Decision Making Risk Prescription drug management.     Differential diagnosis includes but is not limited to asthma, COPD, COVID, flu, RSV, viral URI, strep  pharyngitis, viral pharyngitis, allergic rhinitis, pneumonia, bronchitis   ED Course:  Patient well-appearing, stable vital signs aside from elevated blood pressure.  She reports ongoing wheezing and shortness of breath for the past year.  No acute worsening.  She states she has been taking her albuterol inhaler daily without relief of symptoms.  Today on exam, lungs are clear to auscultation.  I do not appreciate any wheezing on exam.  She is talking in full sentences on room air without difficulty.  She denies any infectious symptoms such as cough, fevers, chills, low concern for URI or pneumonia at this time.  No trauma, lung sounds present bilaterally, no concern for pneumothorax or other emergent etiology. No indication for chest x-ray imaging. This could be asthma, ILD, deconditioning due to increased body habitus. Recommended she follow up with pulmonology for further evaluation.   Patient also reports increased nasal congestion over the past couple weeks.  Suspect this may be due to allergies.  She is already taking Zyrtec, will have her add on Flonase for nasal congestion.  Patient stable and appropriate for discharge home  Impression: Chronic shortness of breath Allergic rhinitis  Disposition:  The patient was discharged home with instructions to take Zyrtec daily and use Flonase as needed for nasal congestion.  May try Afrin for no longer than 3 days if nasal congestion not relieved with Flonase.  Follow-up with pulmonology, their contact information was provided.  Symbicort inhaler was refilled. Establish care with a PCP, contact information was provided for local PCP offices.  She understands her blood pressure was elevated here and she needs to follow-up with PCP within the next month for discussion of her high blood pressures. Return precautions given.               Final Clinical Impression(s) / ED Diagnoses Final diagnoses:  SOB (shortness of breath)  Nasal  congestion    Rx / DC Orders ED Discharge Orders          Ordered    budesonide-formoterol (SYMBICORT) 160-4.5 MCG/ACT inhaler  2 times daily        12/26/23 1331              Arabella Merles, PA-C 12/26/23 1350    Benjiman Core, MD 12/26/23 [phone]

## 2024-01-07 ENCOUNTER — Encounter: Payer: Self-pay | Admitting: Urgent Care

## 2024-01-07 ENCOUNTER — Telehealth: Payer: Self-pay | Admitting: Physician Assistant

## 2024-01-07 DIAGNOSIS — J45909 Unspecified asthma, uncomplicated: Secondary | ICD-10-CM

## 2024-01-07 DIAGNOSIS — J4541 Moderate persistent asthma with (acute) exacerbation: Secondary | ICD-10-CM

## 2024-01-07 MED ORDER — BUDESONIDE 0.5 MG/2ML IN SUSP: 0.5000 mg | Freq: Every day | RESPIRATORY_TRACT | 0 refills | Status: DC

## 2024-01-07 MED ORDER — LEVOCETIRIZINE DIHYDROCHLORIDE 5 MG PO TABS: 5.0000 mg | ORAL_TABLET | Freq: Every evening | ORAL | 2 refills | Status: DC

## 2024-01-07 MED ORDER — ALBUTEROL SULFATE HFA 108 (90 BASE) MCG/ACT IN AERS: 1.0000 | INHALATION_SPRAY | Freq: Four times a day (QID) | RESPIRATORY_TRACT | 0 refills | Status: DC | PRN

## 2024-01-07 MED ORDER — PREDNISONE 20 MG PO TABS: 40.0000 mg | ORAL_TABLET | Freq: Every day | ORAL | 0 refills | Status: AC

## 2024-01-07 MED ORDER — BUDESONIDE-FORMOTEROL FUMARATE 160-4.5 MCG/ACT IN AERO: 2.0000 | INHALATION_SPRAY | Freq: Two times a day (BID) | RESPIRATORY_TRACT | 0 refills | Status: DC

## 2024-01-07 NOTE — Addendum Note (Signed)
 Addended by: Margaretann Loveless on: 01/07/2024 07:26 PM   Modules accepted: Orders

## 2024-01-07 NOTE — Progress Notes (Signed)
 E-Visit for Asthma  Based on what you have shared with me, it looks like you may have a flare up of your asthma.  Asthma is a chronic (ongoing) lung disease which results in airway obstruction, inflammation and hyper-responsiveness.   Asthma symptoms vary from person to person, with common symptoms including nighttime awakening and decreased ability to participate in normal activities as a result of shortness of breath. It is often triggered by changes in weather, changes in the season, changes in air temperature, or inside (home, school, daycare or work) allergens such as animal dander, mold, mildew, woodstoves or cockroaches.   It can also be triggered by hormonal changes, extreme emotion, physical exertion or an upper respiratory tract illness.     It is important to identify the trigger, and then eliminate or avoid the trigger if possible.   If you have been prescribed medications to be taken on a regular basis, it is important to follow the asthma action plan and to follow guidelines to adjust medication in response to increasing symptoms of decreased peak expiratory flow rate  Treatment: I have prescribed: Albuterol (Proventil HFA; Ventolin HFA) 108 (90 Base) MCG/ACT Inhaler 2 puffs into the lungs every six hours as needed for wheezing or shortness of breath and Prednisone 40mg  by mouth per day for 5 - 7 days. I have also refilled your Symbicort and Xyzal as requested. Rinse your mouth out after each puff of Symbicort to prevent oral thrush.  HOME CARE Only take medications as instructed by your medical team. Consider wearing a mask or scarf to improve breathing air temperature have been shown to decrease irritation and decrease exacerbations Get rest. Taking a steamy shower or using a humidifier may help nasal congestion sand ease sore throat pain. You can place a towel over your  head and breathe in the steam from hot water coming from a faucet. Using a saline nasal spray works much the same way.  Cough drops, hare candies and sore throat lozenges may ease your cough.  Avoid close contacts especially the very you and the elderly Cover your mouth if you cough or sneeze Always remember to wash your hands.    GET HELP RIGHT AWAY IF: You develop worsening symptoms; breathlessness at rest, drowsy, confused or agitated, unable to speak in full sentences You have coughing fits You develop a severe headache or visual changes You develop shortness of breath, difficulty breathing or start having chest pain Your symptoms persist after you have completed your treatment plan If your symptoms do not improve within 10 days  MAKE SURE YOU Understand these instructions. Will watch your condition. Will get help right away if you are not doing well or get worse.   Your e-visit answers were reviewed by a board certified advanced clinical practitioner to complete your personal care plan, Depending upon the condition, your plan could have included both over the counter or prescription medications.   Please review your pharmacy choice. Your safety is important to [place]. If you have drug allergies check your prescription carefully.  You can use MyChart to ask questions about today's visit, request a non-urgent  call back, or ask for a work or school excuse for 24 hours related to this e-Visit. If it has been greater than 24 hours you will need to follow up with your provider, or enter a new e-Visit to address those concerns.   You will get an e-mail in the next two days asking about your experience. I hope that your  e-visit has been valuable and will speed your recovery. Thank you for using e-visits.   I have spent 8 minutes in review of e-visit questionnaire, review and updating patient chart, medical decision making and response to patient.   Maretta Bees, PA

## 2024-04-05 ENCOUNTER — Telehealth: Payer: Self-pay | Admitting: Physician Assistant

## 2024-04-05 DIAGNOSIS — R079 Chest pain, unspecified: Secondary | ICD-10-CM

## 2024-04-05 NOTE — Progress Notes (Signed)

## 2024-04-16 ENCOUNTER — Encounter: Admitting: Family Medicine

## 2024-04-19 ENCOUNTER — Telehealth: Admitting: Physician Assistant

## 2024-04-19 DIAGNOSIS — J4541 Moderate persistent asthma with (acute) exacerbation: Secondary | ICD-10-CM

## 2024-04-19 DIAGNOSIS — J302 Other seasonal allergic rhinitis: Secondary | ICD-10-CM

## 2024-04-19 DIAGNOSIS — J45909 Unspecified asthma, uncomplicated: Secondary | ICD-10-CM

## 2024-04-19 MED ORDER — ALBUTEROL SULFATE HFA 108 (90 BASE) MCG/ACT IN AERS: 1.0000 | INHALATION_SPRAY | Freq: Four times a day (QID) | RESPIRATORY_TRACT | 0 refills | Status: DC | PRN

## 2024-04-19 MED ORDER — BUDESONIDE-FORMOTEROL FUMARATE 160-4.5 MCG/ACT IN AERO: 2.0000 | INHALATION_SPRAY | Freq: Two times a day (BID) | RESPIRATORY_TRACT | 0 refills | Status: DC

## 2024-04-19 MED ORDER — CETIRIZINE HCL 10 MG PO TABS: 10.0000 mg | ORAL_TABLET | Freq: Every day | ORAL | 0 refills | Status: DC

## 2024-04-19 MED ORDER — FLUTICASONE PROPIONATE 50 MCG/ACT NA SUSP: 2.0000 | Freq: Every day | NASAL | 0 refills | Status: DC

## 2024-04-19 NOTE — Progress Notes (Signed)
 Virtual Visit Consent   Brandy [name], you are scheduled for a virtual visit with a [facility] provider today. Just as with appointments in the office, your consent must be obtained to participate. Your consent will be active for this visit and any virtual visit you may have with one of our providers in the next 365 days. If you have a MyChart account, a copy of this consent can be sent to you electronically.  As this is a virtual visit, video technology does not allow for your provider to perform a traditional examination. This may limit your provider's ability to fully assess your condition. If your provider identifies any concerns that need to be evaluated in person or the need to arrange testing (such as labs, EKG, etc.), we will make arrangements to do so. Although advances in technology are sophisticated, we cannot ensure that it will always work on either your end or our end. If the connection with a video visit is poor, the visit may have to be switched to a telephone visit. With either a video or telephone visit, we are not always able to ensure that we have a secure connection.  By engaging in this virtual visit, you consent to the provision of healthcare and authorize for your insurance to be billed (if applicable) for the services provided during this visit. Depending on your insurance coverage, you may receive a charge related to this service.  I need to obtain your verbal consent now. Are you willing to proceed with your visit today? [name] has provided verbal consent on 04/19/2024 for a virtual visit (video or telephone). Brandy CHRISTELLA Dickinson, PA-C  Date: 04/19/2024 7:08 PM   Virtual Visit via Video Note   I, Brandy [name], connected with  Brandy [name]  ([id number], July 28, 1996) on 04/19/24 at  7:15 PM EDT by a video-enabled telemedicine application and verified that I am speaking with the correct person using two identifiers.  Location: Patient: Virtual Visit Location  Patient: Home Provider: Virtual Visit Location Provider: Home Office   I discussed the limitations of evaluation and management by telemedicine and the availability of in person appointments. The patient expressed understanding and agreed to proceed.    History of Present Illness: Brandy [name] is a 28 y.o. who identifies as a female who was assigned female at birth, and is being seen today for asthma and medication refills. Requesting refills on inhalers albuterol  and Symbicort .   Asthma is triggered by allergies so she is requesting a refill on allergy medications.   Has a nebulizer, but it has broken (not putting out medication).  New patient appt with new PCP scheduled for 05/05/24.   Problems:  Patient Active Problem List   Diagnosis Date Noted   Irregular menstrual bleeding 07/17/2017   Obesity, morbid, BMI 40.0-49.9 (HCC) 09/11/2016   Cholecystitis with cholelithiasis 11/16/2015   Constipation    Nausea    Abdominal pain, recurrent     Allergies: No Known Allergies Medications:  Current Outpatient Medications:    albuterol  (VENTOLIN  HFA) 108 (90 Base) MCG/ACT inhaler, Inhale 1-2 puffs into the lungs every 6 (six) hours as needed for wheezing or shortness of breath., Disp: 6.7 g, Rfl: 0   budesonide  (PULMICORT ) 0.5 MG/2ML nebulizer solution, Take 2 mLs (0.5 mg total) by nebulization daily., Disp: 150 mL, Rfl: 0   budesonide -formoterol  (SYMBICORT ) 160-4.5 MCG/ACT inhaler, Inhale 2 puffs into the lungs 2 (two) times daily., Disp: 30.6 g, Rfl: 0   fluticasone  (FLONASE )  50 MCG/ACT nasal spray, Place 2 sprays into both nostrils daily., Disp: 16 g, Rfl: 0   ipratropium-albuterol  (DUONEB) 0.5-2.5 (3) MG/3ML SOLN, Take 3 mLs by nebulization every 6 (six) hours as needed., Disp: 360 mL, Rfl: 3   levocetirizine (XYZAL ) 5 MG tablet, Take 1 tablet (5 mg total) by mouth every evening., Disp: 30 tablet, Rfl: 2  Observations/Objective: Patient is well-developed, well-nourished in no acute  distress.  Resting comfortably at home.  Head is normocephalic, atraumatic.  No labored breathing.  Speech is clear and coherent with logical content.  Patient is alert and oriented at baseline.    Assessment and Plan: There are no diagnoses linked to this encounter. - Refilled Albuterol  and Symbicort  - Refilled Flonase  - Changed Xyzal  to Cetirizine  on patient preference - Advised will need in person evaluation for Nebulizer prescription to meet DME criteria, voiced understanding - Keep scheduled new patient appt on 05/05/24 - Seek in person evaluation if symptoms worsen prior to that appt  Follow Up Instructions: I discussed the assessment and treatment plan with the patient. The patient was provided an opportunity to ask questions and all were answered. The patient agreed with the plan and demonstrated an understanding of the instructions.  A copy of instructions were sent to the patient via MyChart unless otherwise noted below.    The patient was advised to call back or seek an in-person evaluation if the symptoms worsen or if the condition fails to improve as anticipated.    Brandy CHRISTELLA Dickinson, PA-C

## 2024-04-19 NOTE — Patient Instructions (Signed)
 Brandy [name], thank you for joining Delon CHRISTELLA Dickinson, PA-C for today's virtual visit.  While this provider is not your primary care provider (PCP), if your PCP is located in our provider database this encounter information will be shared with them immediately following your visit.   A [facility] MyChart account gives you access to today's visit and all your visits, tests, and labs performed at [facility]  click here if you don't have a [facility] MyChart account or go to [email or web address]  Consent: (Patient) Brandy [name] provided verbal consent for this virtual visit at the beginning of the encounter.  Current Medications:  Current Outpatient Medications:    albuterol  (VENTOLIN  HFA) 108 (90 Base) MCG/ACT inhaler, Inhale 1-2 puffs into the lungs every 6 (six) hours as needed for wheezing or shortness of breath., Disp: 18 g, Rfl: 0   budesonide  (PULMICORT ) 0.5 MG/2ML nebulizer solution, Take 2 mLs (0.5 mg total) by nebulization daily., Disp: 150 mL, Rfl: 0   budesonide -formoterol  (SYMBICORT ) 160-4.5 MCG/ACT inhaler, Inhale 2 puffs into the lungs 2 (two) times daily., Disp: 30.6 g, Rfl: 0   cetirizine  (ZYRTEC ) 10 MG tablet, Take 1 tablet (10 mg total) by mouth daily., Disp: 30 tablet, Rfl: 0   fluticasone  (FLONASE ) 50 MCG/ACT nasal spray, Place 2 sprays into both nostrils daily., Disp: 16 g, Rfl: 0   ipratropium-albuterol  (DUONEB) 0.5-2.5 (3) MG/3ML SOLN, Take 3 mLs by nebulization every 6 (six) hours as needed., Disp: 360 mL, Rfl: 3   Medications ordered in this encounter:  Meds ordered this encounter  Medications   albuterol  (VENTOLIN  HFA) 108 (90 Base) MCG/ACT inhaler    Sig: Inhale 1-2 puffs into the lungs every 6 (six) hours as needed for wheezing or shortness of breath.    Dispense:  18 g    Refill:  0    Supervising Provider:   LAMPTEY, PHILIP O [[id number]]   budesonide -formoterol  (SYMBICORT ) 160-4.5 MCG/ACT inhaler    Sig: Inhale 2 puffs into the lungs 2  (two) times daily.    Dispense:  30.6 g    Refill:  0    Supervising Provider:   LAMPTEY, PHILIP O [id number]   fluticasone  (FLONASE ) 50 MCG/ACT nasal spray    Sig: Place 2 sprays into both nostrils daily.    Dispense:  16 g    Refill:  0    Supervising Provider:   BLAISE ALEENE KIDD [[id number]]   cetirizine  (ZYRTEC ) 10 MG tablet    Sig: Take 1 tablet (10 mg total) by mouth daily.    Dispense:  30 tablet    Refill:  0    Supervising Provider:   LAMPTEY, PHILIP O [[id number]]     *If you need refills on other medications prior to your next appointment, please contact your pharmacy*  Follow-Up: Call back or seek an in-person evaluation if the symptoms worsen or if the condition fails to improve as anticipated.  [facility] [phone]  Other Instructions  Asthma, Adult  Asthma is a long-term (chronic) condition that causes recurrent episodes in which the lower airways in the lungs become tight and narrow. The narrowing is caused by inflammation and tightening of the smooth muscle around the lower airways. Asthma episodes, also called asthma attacks or asthma flares, may cause coughing, making high-pitched whistling sounds when you breathe, most often when you breathe out (wheezing), shortness of breath, and chest pain. The airways may produce extra mucus caused by the inflammation and irritation. During an attack,  it can be difficult to breathe. Asthma attacks can range from minor to life-threatening. Asthma cannot be cured, but medicines and lifestyle changes can help control it and treat acute attacks. It is important to keep your asthma well controlled so the condition does not interfere with your daily life. What are the causes? This condition is believed to be caused by inherited (genetic) and environmental factors, but its exact cause is not known. What can trigger an asthma attack? Many things can bring on an asthma attack or make symptoms worse. These triggers are  different for every person. Common triggers include: Allergens and irritants like mold, dust, pet dander, cockroaches, pollen, air pollution, and chemical odors. Cigarette smoke. Weather changes and cold air. Stress and strong emotional responses such as crying or laughing hard. Certain medications such as aspirin or beta blockers. Infections and inflammatory conditions, such as the flu, a cold, pneumonia, or inflammation of the nasal membranes (rhinitis). Gastroesophageal reflux disease (GERD). What are the signs or symptoms? Symptoms may occur right after exposure to an asthma trigger or hours later and can vary by person. Common signs and symptoms include: Wheezing. Trouble breathing (shortness of breath). Excessive nighttime or early morning coughing. Chest tightness. Tiredness (fatigue) with minimal activity. Difficulty talking in complete sentences. Poor exercise tolerance. How is this diagnosed? This condition is diagnosed based on: A physical exam and your medical history. Tests, which may include: Lung function studies to evaluate the flow of air in your lungs. Allergy tests. Imaging tests, such as X-rays. How is this treated? There is no cure, but symptoms can be controlled with proper treatment. Treatment usually involves: Identifying and avoiding your asthma triggers. Inhaled medicines. Two types are commonly used to treat asthma, depending on severity: Controller medicines. These help prevent asthma symptoms from occurring. They are taken every day. Fast-acting reliever or rescue medicines. These quickly relieve asthma symptoms. They are used as needed and provide short-term relief. Using other medicines, such as: Allergy medicines, such as antihistamines, if your asthma attacks are triggered by allergens. Immune medicines (immunomodulators). These are medicines that help control the immune system. Using supplemental oxygen. This is only needed during a severe  episode. Creating an asthma action plan. An asthma action plan is a written plan for managing and treating your asthma attacks. This plan includes: A list of your asthma triggers and how to avoid them. Information about when medicines should be taken and when their dosage should be changed. Instructions about using a device called a peak flow meter. A peak flow meter measures how well the lungs are working and the severity of your asthma. It helps you monitor your condition. Follow these instructions at home: Take over-the-counter and prescription medicines only as told by your health care provider. Stay up to date on all vaccinations as recommended by your healthcare provider, including vaccines for the flu and pneumonia. Use a peak flow meter and keep track of your peak flow readings. Understand and use your asthma action plan to address any asthma flares. Do not smoke or allow anyone to smoke in your home. Contact a health care provider if: You have wheezing, shortness of breath, or a cough that is not responding to medicines. Your medicines are causing side effects, such as a rash, itching, swelling, or trouble breathing. You need to use a reliever medicine more than 2-3 times a week. Your peak flow reading is still at 50-79% of your personal best after following your action plan for 1 hour. You  have a fever and shortness of breath. Get help right away if: You are getting worse and do not respond to treatment during an asthma attack. You are short of breath when at rest or when doing very little physical activity. You have difficulty eating, drinking, or talking. You have chest pain or tightness. You develop a fast heartbeat or palpitations. You have a bluish color to your lips or fingernails. You are light-headed or dizzy, or you faint. Your peak flow reading is less than 50% of your personal best. You feel too tired to breathe normally. These symptoms may be an emergency. Get help  right away. Call 911. Do not wait to see if the symptoms will go away. Do not drive yourself to the hospital. Summary Asthma is a long-term (chronic) condition that causes recurrent episodes in which the airways become tight and narrow. Asthma episodes, also called asthma attacks or asthma flares, can cause coughing, wheezing, shortness of breath, and chest pain. Asthma cannot be cured, but medicines and lifestyle changes can help keep it well controlled and prevent asthma flares. Make sure you understand how to avoid triggers and how and when to use your medicines. Asthma attacks can range from minor to life-threatening. Get help right away if you have an asthma attack and do not respond to treatment with your usual rescue medicines. This information is not intended to replace advice given to you by your health care provider. Make sure you discuss any questions you have with your health care provider. Document Revised: 07/04/2021 Document Reviewed: 06/25/2021 Elsevier Patient Education  2024 [employer]vier [employer].   If you have been instructed to have an in-person evaluation today at a local Urgent Care facility, please use the link below. It will take you to a list of all of our available [facility], including address, phone number and hours of operation. Please do not delay care.  [facility]  If you or a family member do not have a primary care provider, use the link below to schedule a visit and establish care. When you choose a [facility] primary care physician or advanced practice provider, you gain a long-term partner in health. Find a Primary Care Provider  Learn more about [facility]'s in-office and virtual care options: [facility] - Get Care Now

## 2024-04-19 NOTE — Progress Notes (Signed)
   Thank you for the details you included in the comment boxes. Those details are very helpful in determining the best course of treatment for you and help us  to provide the best care. Because of needing chronic medication refills, we recommend that you schedule a Virtual Urgent Care video visit in order for the provider to better assess what is going on.  The provider will be able to give you a more accurate diagnosis and treatment plan if we can more freely discuss your symptoms and with the addition of a virtual examination.   If you change your visit to a video visit, we will bill your insurance (similar to an office visit) and you will not be charged for this e-Visit. You will be able to stay at home and speak with the first available [facility] advanced practice provider. The link to do a video visit is in the drop down Menu tab of your Welcome screen in MyChart.      I have spent 5 minutes in review of e-visit questionnaire, review and updating patient chart, medical decision making and response to patient.   Delon CHRISTELLA Dickinson, PA-C

## 2024-05-05 ENCOUNTER — Ambulatory Visit: Admitting: Student

## 2024-05-05 VITALS — BP 146/98 | HR 88 | Ht 66.0 in | Wt 300.6 lb

## 2024-05-05 DIAGNOSIS — Z6841 Body Mass Index (BMI) 40.0 and over, adult: Secondary | ICD-10-CM | POA: Diagnosis not present

## 2024-05-05 DIAGNOSIS — I10 Essential (primary) hypertension: Secondary | ICD-10-CM | POA: Diagnosis not present

## 2024-05-05 DIAGNOSIS — N946 Dysmenorrhea, unspecified: Secondary | ICD-10-CM | POA: Diagnosis not present

## 2024-05-05 DIAGNOSIS — R030 Elevated blood-pressure reading, without diagnosis of hypertension: Secondary | ICD-10-CM | POA: Diagnosis not present

## 2024-05-05 DIAGNOSIS — Z124 Encounter for screening for malignant neoplasm of cervix: Secondary | ICD-10-CM | POA: Diagnosis not present

## 2024-05-05 DIAGNOSIS — J4541 Moderate persistent asthma with (acute) exacerbation: Secondary | ICD-10-CM | POA: Diagnosis not present

## 2024-05-05 DIAGNOSIS — Z7689 Persons encountering health services in other specified circumstances: Secondary | ICD-10-CM

## 2024-05-05 DIAGNOSIS — J302 Other seasonal allergic rhinitis: Secondary | ICD-10-CM | POA: Insufficient documentation

## 2024-05-05 DIAGNOSIS — J45909 Unspecified asthma, uncomplicated: Secondary | ICD-10-CM | POA: Diagnosis not present

## 2024-05-05 MED ORDER — AMLODIPINE BESYLATE 5 MG PO TABS: 5.0000 mg | ORAL_TABLET | Freq: Every day | ORAL | 0 refills | Status: DC

## 2024-05-05 MED ORDER — BUDESONIDE 0.5 MG/2ML IN SUSP: 0.5000 mg | Freq: Every day | RESPIRATORY_TRACT | 0 refills | Status: DC

## 2024-05-05 NOTE — Assessment & Plan Note (Signed)
 Rx- refill Pulmicort  and submitted form to Health for new nebulizer.  Referral placed for pulmonology for further evaluation.

## 2024-05-05 NOTE — Assessment & Plan Note (Signed)
 FU 4 weeks CPE

## 2024-05-05 NOTE — Assessment & Plan Note (Addendum)
 Poorly controlled . Rx- amlodipine  5 mg initiated. Medication and common side effects reviewed with the patient; patient voiced understanding and had no further questions at this time.  Encouraged DASH diet, weight loss, minimize caffeine and obtain adequate sleep. Report concerning symptoms and follow up as directed and as needed.  Encourage monitoring blood pressure at home, RTC >140/90.

## 2024-05-05 NOTE — Assessment & Plan Note (Addendum)
 Encouraged 150 minutes/week of moderate aerobic exercise and strength training twice weekly.  Discussed behavioral changes and goal setting; patient encouraged to track progress. Educated on MyPlate guidelines, for healthy balanced meals that includes portions of fruits, vegetables, whole grains, lean protein, and low-fat dairy to support healthy weight loss. Consider referral to nutritionist, or weight management program if no improvement.

## 2024-05-05 NOTE — Assessment & Plan Note (Signed)
 C/O uncontrolled allergy symptoms, clear rhinorrhea.  She has not been using Claritin  daily.  Encouraged daily use of Claritin  and Flonase  for symptom control.  Follow-up if no improvement.

## 2024-05-05 NOTE — Patient Instructions (Signed)

## 2024-05-05 NOTE — Progress Notes (Signed)
 Subjective:     Patient ID: Brandy [name], female    DOB: 1996-08-17, 28 y.o.   MRN: [id number]  Chief Complaint  Patient presents with   New Patient (Initial Visit)    Patient presents today for a new patient appointment.    HPI Brandy [name] presents to establish care.  She is not married, has female partner of 2 years.  No children.  She is currently unemployed.  Reports highest level of education high school.  Endorses occasional EtOH use, denies smoking.  Does not have prior PCP.  Psx-cholecystectomy  Hx Asthma Albuterol  PRN, budesonide - fomoterol (SYMBICORT )- 2 puffs daily, uncontrolled Using albuterol  daily 2-3 times, Pulmicort  nebulizer Reports taking medications as prescribed, however she has not had her Pulmicort  nebulizer due to nebulizer working improperly.  Allergy Zyrtec  10 mg daily, Flonase   LMP: Irregular- follows with GYN  Prior PCP: Denies   Presents with multiple complaints  Requesting new nebulizer machine.  Reports her current nebulizer is not working properly, she has not been able to use her Pulmicort  neb. Reports using Symbicort  daily, also using albuterol  multiple times per day.  Reports multiple prednisone  tapers due to uncontrolled asthma symptoms,last prednisone  taper reported 3 to 4 months ago.  C/o irregular menses.  This has been a chronic issue she has discussed this with GYN in the past.  They have encouraged her to lose weight, reports that she has been trying to lose weight without success.  Concerns about blood pressure.  She has noticed her blood pressure has been increased over the past few months. BP Readings from Last 3 Encounters:  05/05/24 (!) 146/98  12/26/23 (!) 183/138  02/08/23 132/86    Concerned about weight, requesting information on GLP-1 medications to aid in weight loss.  Reports that she has tried dieting, portion control, increasing exercise though not having success with weight loss efforts. BMI Readings from Last 1  Encounters:  05/05/24 48.52 kg/m     Patient denies fever, chills, SOB, CP, palpitations, dyspnea, edema, HA, vision changes, N/V/D, abdominal pain, urinary symptoms, rash, and recent illness or hospitalizations.    History of Present Illness              Health Maintenance Due  Topic Date Due   Pneumococcal Vaccine: 19-49 Years (1 of 2 - PCV) Never done   Hepatitis B Vaccines (1 of 3 - 19+ 3-dose series) Never done   Cervical Cancer Screening (Pap smear)  Never done   HPV VACCINES (2 - Risk 3-dose series) 09/03/2018   DTaP/Tdap/Td (7 - Td or Tdap) 03/14/2019   INFLUENZA VACCINE  04/30/2024    Past Medical History:  Diagnosis Date   ADHD (attention deficit hyperactivity disorder)    Allergy    Asthma    Chlamydia 07/15/2017   Depression    GERD (gastroesophageal reflux disease)    Hypertension    My blood pressure has been high the last few months   Trichomonal vaginitis 07/15/2017    Past Surgical History:  Procedure Laterality Date   CHOLECYSTECTOMY N/A 11/17/2015   Procedure: LAPAROSCOPIC CHOLECYSTECTOMY ATTEMPTED INTRAOPERATIVE CHOLANGIOGRAM ;  Surgeon: Debby Shipper, MD;  Location: [facility] OR;  Service: General;  Laterality: N/A;    Family History  Problem Relation Age of Onset   Asthma Mother    Depression Mother    Asthma Sister    ADD / ADHD Sister    Diabetes Maternal Grandfather    Heart disease Maternal Grandfather  Social History   Socioeconomic History   Marital status: Single    Spouse name: Not on file   Number of children: 0   Years of education: Not on file   Highest education level: Associate degree: academic program  Occupational History   Not on file  Tobacco Use   Smoking status: Never   Smokeless tobacco: Never  Vaping Use   Vaping status: Never Used  Substance and Sexual Activity   Alcohol use: Yes   Drug use: Never   Sexual activity: Yes    Birth control/protection: None  Other Topics Concern   Not on file  Social  History Narrative   Not on file   Social Drivers of Health   Financial Resource Strain: Low Risk  (05/05/2024)   Overall Financial Resource Strain (CARDIA)    Difficulty of Paying Living Expenses: Not very hard  Food Insecurity: Food Insecurity Present (05/05/2024)   Hunger Vital Sign    Worried About Running Out of Food in the Last Year: Sometimes true    Ran Out of Food in the Last Year: Sometimes true  Transportation Needs: No Transportation Needs (05/05/2024)   PRAPARE - Administrator, Civil Service (Medical): No    Lack of Transportation (Non-Medical): No  Physical Activity: Insufficiently Active (05/05/2024)   Exercise Vital Sign    Days of Exercise per Week: 2 days    Minutes of Exercise per Session: 30 min  Stress: Stress Concern Present (05/05/2024)   [employer] of Occupational Health - Occupational Stress Questionnaire    Feeling of Stress: To some extent  Social Connections: Unknown (05/05/2024)   Social Connection and Isolation Panel    Frequency of Communication with Friends and Family: More than three times a week    Frequency of Social Gatherings with Friends and Family: Twice a week    Attends Religious Services: 1 to 4 times per year    Active Member of [employer] or Organizations: No    Attends Engineer, structural: Not on file    Marital Status: Patient declined  Catering manager Violence: Not on file    Outpatient Medications Prior to Visit  Medication Sig Dispense Refill   albuterol  (VENTOLIN  HFA) 108 (90 Base) MCG/ACT inhaler Inhale 1-2 puffs into the lungs every 6 (six) hours as needed for wheezing or shortness of breath. 18 g 0   budesonide -formoterol  (SYMBICORT ) 160-4.5 MCG/ACT inhaler Inhale 2 puffs into the lungs 2 (two) times daily. 30.6 g 0   fluticasone  (FLONASE ) 50 MCG/ACT nasal spray Place 2 sprays into both nostrils daily. 16 g 0   ipratropium-albuterol  (DUONEB) 0.5-2.5 (3) MG/3ML SOLN Take 3 mLs by nebulization every 6 (six) hours as  needed. 360 mL 3   cetirizine  (ZYRTEC ) 10 MG tablet Take 1 tablet (10 mg total) by mouth daily. (Patient not taking: Reported on 05/05/2024) 30 tablet 0   budesonide  (PULMICORT ) 0.5 MG/2ML nebulizer solution Take 2 mLs (0.5 mg total) by nebulization daily. (Patient not taking: Reported on 05/05/2024) 150 mL 0   No facility-administered medications prior to visit.    No Known Allergies  ROS    See HPI Objective:    Physical Exam  General: No acute distress. Awake and conversant. +obese Eyes: Normal conjunctiva, anicteric. Round symmetric pupils.  ENT: Hearing grossly intact. No nasal discharge.  Respiratory: CTAB. Respirations are non-labored. No wheezing.  Skin: Warm. No rashes or ulcers.  Psych: Alert and oriented. Cooperative. CV: RRR. No murmur. No lower extremity edema.  MSK: Normal ambulation. No clubbing or cyanosis.  Neuro:  CN II-XII grossly normal.     BP (!) 146/98   Pulse 88   Ht 5' 6 (1.676 m)   Wt (!) 300 lb 9.6 oz (136.4 kg)   SpO2 94%   BMI 48.52 kg/m  Wt Readings from Last 3 Encounters:  05/05/24 (!) 300 lb 9.6 oz (136.4 kg)  12/26/23 290 lb (131.5 kg)  02/08/23 233 lb 11 oz (106 kg)       Assessment & Plan:   Problem List Items Addressed This Visit     Encounter to establish care - Primary   FU 4 weeks CPE      Moderate persistent asthma with acute exacerbation   Relevant Medications   budesonide  (PULMICORT ) 0.5 MG/2ML nebulizer solution   Other Relevant Orders   For home use only DME Nebulizer machine   Morbid obesity with BMI of 45.0-49.9, adult (HCC)   Encouraged 150 minutes/week of moderate aerobic exercise and strength training twice weekly.  Discussed behavioral changes and goal setting; patient encouraged to track progress. Educated on MyPlate guidelines, for healthy balanced meals that includes portions of fruits, vegetables, whole grains, lean protein, and low-fat dairy to support healthy weight loss. Consider referral to nutritionist, or  weight management program if no improvement.       Primary hypertension   Poorly controlled . Rx- amlodipine  5 mg initiated. Medication and common side effects reviewed with the patient; patient voiced understanding and had no further questions at this time.  Encouraged DASH diet, weight loss, minimize caffeine and obtain adequate sleep. Report concerning symptoms and follow up as directed and as needed.  Encourage monitoring blood pressure at home, RTC >140/90.      Relevant Medications   amLODipine  (NORVASC ) 5 MG tablet   Seasonal allergies   C/O uncontrolled allergy symptoms, clear rhinorrhea.  She has not been using Claritin  daily.  Encouraged daily use of Claritin  and Flonase  for symptom control.  Follow-up if no improvement.      Uncontrolled asthma   Rx- refill Pulmicort  and submitted form to Health for new nebulizer.  Referral placed for pulmonology for further evaluation.      Relevant Medications   budesonide  (PULMICORT ) 0.5 MG/2ML nebulizer solution   Other Relevant Orders   Ambulatory referral to Pulmonology   Other Visit Diagnoses       Elevated BP without diagnosis of hypertension         Screening for cervical cancer       Relevant Orders   Ambulatory referral to Gynecology     Dysmenorrhea       Relevant Orders   Ambulatory referral to Gynecology       I am having Brandy [name] start on amLODipine . I am also having her maintain her ipratropium-albuterol , albuterol , budesonide -formoterol , fluticasone , cetirizine , and budesonide .  Meds ordered this encounter  Medications   budesonide  (PULMICORT ) 0.5 MG/2ML nebulizer solution    Sig: Take 2 mLs (0.5 mg total) by nebulization daily.    Dispense:  150 mL    Refill:  0    Supervising Provider:   DOMENICA BLACKBIRD A [[id number]]   amLODipine  (NORVASC ) 5 MG tablet    Sig: Take 1 tablet (5 mg total) by mouth daily.    Dispense:  60 tablet    Refill:  0    Supervising Provider:   DOMENICA BLACKBIRD A [[id number]]

## 2024-05-07 ENCOUNTER — Other Ambulatory Visit (HOSPITAL_COMMUNITY): Payer: Self-pay

## 2024-05-10 DIAGNOSIS — J4541 Moderate persistent asthma with (acute) exacerbation: Secondary | ICD-10-CM | POA: Diagnosis not present

## 2024-05-12 ENCOUNTER — Encounter: Payer: Self-pay | Admitting: Student

## 2024-05-12 MED ORDER — WEGOVY 0.25 MG/0.5ML ~~LOC~~ SOAJ: 0.2500 mg | SUBCUTANEOUS | 3 refills | Status: DC

## 2024-05-14 ENCOUNTER — Other Ambulatory Visit (HOSPITAL_COMMUNITY): Payer: Self-pay

## 2024-05-14 ENCOUNTER — Telehealth: Payer: Self-pay

## 2024-05-14 NOTE — Telephone Encounter (Signed)
 Pharmacy Patient Advocate Encounter   Received notification from Onbase that prior authorization for Wegovy  0.25 is required/requested.   Insurance verification completed.   The patient is insured through [facility] .   Per test claim: PA required; PA submitted to above mentioned insurance via Latent Key/confirmation #/EOC [id number] Status is pending

## 2024-05-14 NOTE — Telephone Encounter (Signed)
 Pharmacy Patient Advocate Encounter  Received notification from [facility] that Prior Authorization for Wegovy  0.25 has been APPROVED from 05/14/24 to 11/10/24. Ran test claim, Copay is $4.00. This test claim was processed through [facility]- copay amounts may vary at other pharmacies due to pharmacy/plan contracts, or as the patient moves through the different stages of their insurance plan.     PA #/Case ID/Reference #: [id number]

## 2024-05-31 DIAGNOSIS — Z0000 Encounter for general adult medical examination without abnormal findings: Secondary | ICD-10-CM | POA: Insufficient documentation

## 2024-05-31 DIAGNOSIS — F32A Depression, unspecified: Secondary | ICD-10-CM | POA: Insufficient documentation

## 2024-05-31 DIAGNOSIS — R739 Hyperglycemia, unspecified: Secondary | ICD-10-CM | POA: Insufficient documentation

## 2024-05-31 DIAGNOSIS — D649 Anemia, unspecified: Secondary | ICD-10-CM | POA: Insufficient documentation

## 2024-05-31 NOTE — Assessment & Plan Note (Signed)
 Rx- refill Pulmicort  and submitted form to Health for new nebulizer. Referral placed prior OV pulmonology for further evaluation. Pt has done ?????????????????????????????

## 2024-05-31 NOTE — Progress Notes (Unsigned)
 Subjective:     Patient ID: Brandy [name], female    DOB: [birth year], 28 y.o.   MRN: [id number]  No chief complaint on file.   HPI Brandy [name] presents for FU chronic conditions and annual physical  Brandy [name] a 28 y.o.  female/female***presents today for a complete physical exam. Pt reports consuming a {diet types:17450} diet. {types:19826} Pt generally feels {DESC; WELL/FAIRLY WELL/POORLY:18703}. Reports sleeping {DESC; WELL/FAIRLY WELL/POORLY:18703}.  {does/does not:200015} have additional problems to discuss today.   New Surgeries or Family Hx: ***Yes/ Denies Habits: [name], Brandy [name], Brandy [name], Brandy [name], caffeine  LMP: Irregular- follows with GYN??*** WHO?!***  HCM:   Pap: Last?? Referall has been set to GYN  Requesting new nebulizer machine.  Reports her current nebulizer is not working properly, she has not been able to use her Pulmicort  neb. Reports using Symbicort  daily, also using albuterol  multiple times per day.  Reports multiple prednisone  tapers due to uncontrolled asthma symptoms,last prednisone  taper reported 3 to 4 months ago.   C/o irregular menses.  Refferal sent to GYN last OV. Brandy has ***   Psx-cholecystectomy  HTN Amlodipine  5 mg daily Home BP: **** Reports no adverse Ses***  Hx Asthma Albuterol  PRN, budesonide - fomoterol (SYMBICORT )- 2 puffs daily, uncontrolled Using albuterol  daily 2-3 times, Pulmicort  nebulizer Reports taking medications as prescribed, however she has not had her Pulmicort  nebulizer due to nebulizer working improperly.  Allergy Zyrtec  10 mg daily, Flonase   Obesity Semeglutide  (Wegovy ) **** WHAT DOSE***  wEIGHT LOSS?a SEs        Presents with multiple complaints     Concerns about blood pressure.  She has noticed her blood pressure has been increased over the past few months. BP Readings from Last 3 Encounters:  05/05/24 (!) 146/98  12/26/23 (!) 183/138  02/08/23 132/86    Concerned about  weight, requesting information on GLP-1 medications to aid in weight loss.  Reports that she has tried dieting, portion control, increasing exercise though not having success with weight loss efforts. BMI Readings from Last 1 Encounters:  05/05/24 48.52 kg/m     Patient denies fever, chills, SOB, CP, palpitations, dyspnea, edema, HA, vision changes, N/V/D, abdominal pain, urinary symptoms, rash, and recent illness or hospitalizations.    History of Present Illness              Health Maintenance Due  Topic Date Due   Pneumococcal Vaccine (1 of 2 - PCV) Never done   Cervical Cancer Screening (Pap smear)  Never done   HPV VACCINES (2 - Risk 3-dose series) 09/03/2018   DTaP/Tdap/Td (7 - Td or Tdap) 03/14/2019   INFLUENZA VACCINE  04/30/2024   COVID-19 Vaccine (1 - 2024-25 season) Never done    Past Medical History:  Diagnosis Date   ADHD (attention deficit hyperactivity disorder)    Allergy    Asthma    Chlamydia 07/15/2017   Depression    GERD (gastroesophageal reflux disease)    Hypertension    My blood pressure has been high the last few months   Trichomonal vaginitis 07/15/2017    Past Surgical History:  Procedure Laterality Date   CHOLECYSTECTOMY N/A 11/17/2015   Procedure: LAPAROSCOPIC CHOLECYSTECTOMY ATTEMPTED INTRAOPERATIVE CHOLANGIOGRAM ;  Surgeon: Debby Shipper, MD;  Location: [facility] OR;  Service: General;  Laterality: N/A;    Family History  Problem Relation Age of Onset   Asthma Mother    Depression Mother    Asthma Sister    ADD / ADHD  Sister    Diabetes Maternal Grandfather    Heart disease Maternal Grandfather     Social History   Socioeconomic History   Marital status: Single    Spouse name: Not on file   Number of children: 0   Years of education: Not on file   Highest education level: Associate degree: academic program  Occupational History   Not on file  Tobacco Use   Brandy [name] status: Never   Smokeless tobacco: Never  Vaping Use   Vaping  status: Never Used  Substance and Sexual Activity   Alcohol use: Yes   Drug use: Never   Sexual activity: Yes    Birth control/protection: None  Other Topics Concern   Not on file  Social History Narrative   Not on file   Social Drivers of Health   Financial Resource Strain: Low Risk  (05/05/2024)   Overall Financial Resource Strain (CARDIA)    Difficulty of Paying Living Expenses: Not very hard  Food Insecurity: Food Insecurity Present (05/05/2024)   Hunger Vital Sign    Worried About Running Out of Food in the Last Year: Sometimes true    Ran Out of Food in the Last Year: Sometimes true  Transportation Needs: No Transportation Needs (05/05/2024)   PRAPARE - Administrator, Civil Service (Medical): No    Lack of Transportation (Non-Medical): No  Physical Activity: Insufficiently Active (05/05/2024)   Exercise Vital Sign    Days of Exercise per Week: 2 days    Minutes of Exercise per Session: 30 min  Stress: Stress Concern Present (05/05/2024)   [employer] of Occupational Health - Occupational Stress Questionnaire    Feeling of Stress: To some extent  Social Connections: Unknown (05/05/2024)   Social Connection and Isolation Panel    Frequency of Communication with Friends and Family: More than three times a week    Frequency of Social Gatherings with Friends and Family: Twice a week    Attends Religious Services: 1 to 4 times per year    Active Member of [employer] or Organizations: No    Attends Engineer, structural: Not on file    Marital Status: Patient declined  Catering manager Violence: Not on file    Outpatient Medications Prior to Visit  Medication Sig Dispense Refill   albuterol  (VENTOLIN  HFA) 108 (90 Base) MCG/ACT inhaler Inhale 1-2 puffs into the lungs every 6 (six) hours as needed for wheezing or shortness of breath. 18 g 0   amLODipine  (NORVASC ) 5 MG tablet Take 1 tablet (5 mg total) by mouth daily. 60 tablet 0   budesonide  (PULMICORT ) 0.5 MG/2ML  nebulizer solution Take 2 mLs (0.5 mg total) by nebulization daily. 150 mL 0   budesonide -formoterol  (SYMBICORT ) 160-4.5 MCG/ACT inhaler Inhale 2 puffs into the lungs 2 (two) times daily. 30.6 g 0   cetirizine  (ZYRTEC ) 10 MG tablet Take 1 tablet (10 mg total) by mouth daily. (Patient not taking: Reported on 05/05/2024) 30 tablet 0   fluticasone  (FLONASE ) 50 MCG/ACT nasal spray Place 2 sprays into both nostrils daily. 16 g 0   ipratropium-albuterol  (DUONEB) 0.5-2.5 (3) MG/3ML SOLN Take 3 mLs by nebulization every 6 (six) hours as needed. 360 mL 3   semaglutide -weight management (WEGOVY ) 0.25 MG/0.5ML SOAJ SQ injection Inject 0.25 mg into the skin once a week. After 4 weeks, can increase to 0.5 mg weekly for 4 weeks, then 1 mg weekly for 4 weeks 2 mL 3   No facility-administered medications prior to visit.  No Known Allergies  ROS    See HPI Objective:    Physical Exam Constitutional:      General: She is not in acute distress.    Appearance: She is obese. She is not ill-appearing, toxic-appearing or diaphoretic.  HENT:     Head: Normocephalic and atraumatic.     Right Ear: Tympanic membrane, ear canal and external ear normal.     Left Ear: Tympanic membrane, ear canal and external ear normal.     Nose: Nose normal. No congestion.     Mouth/Throat:     Mouth: Mucous membranes are moist.     Pharynx: Oropharynx is clear.  Eyes:     Extraocular Movements: Extraocular movements intact.     Right eye: Normal extraocular motion.     Left eye: Normal extraocular motion.     Conjunctiva/sclera: Conjunctivae normal.     Pupils: Pupils are equal, round, and reactive to light.  Neck:     Thyroid: No thyroid mass or thyromegaly.     Vascular: No carotid bruit or JVD.  Cardiovascular:     Rate and Rhythm: Normal rate and regular rhythm.     Pulses: Normal pulses.          Radial pulses are 2+ on the right side and 2+ on the left side.       Dorsalis pedis pulses are 2+ on the right side  and 2+ on the left side.     Heart sounds: Normal heart sounds, S1 normal and S2 normal. No murmur heard.    No friction rub. No gallop.  Pulmonary:     Effort: Pulmonary effort is normal. No respiratory distress.     Breath sounds: Normal breath sounds.  Abdominal:     General: Bowel sounds are normal. There is no distension.     Palpations: Abdomen is soft.     Tenderness: There is no abdominal tenderness. There is no guarding.  Musculoskeletal:        General: Normal range of motion.     Cervical back: Full passive range of motion without pain and normal range of motion. No edema or erythema.     Right lower leg: No edema.     Left lower leg: No edema.  Lymphadenopathy:     Cervical: No cervical adenopathy.  Skin:    General: Skin is warm and dry.     Capillary Refill: Capillary refill takes less than 2 seconds.  Neurological:     General: No focal deficit present.     Mental Status: She is alert and oriented to person, place, and time.     Cranial Nerves: No cranial nerve deficit.     Motor: No weakness.     Coordination: Coordination normal.     Gait: Gait normal.     Deep Tendon Reflexes: Reflexes normal.  Psychiatric:        Mood and Affect: Mood normal.        Behavior: Behavior normal.        Thought Content: Thought content normal.        There were no vitals taken for this visit. Wt Readings from Last 3 Encounters:  05/05/24 (!) 300 lb 9.6 oz (136.4 kg)  12/26/23 290 lb (131.5 kg)  02/08/23 233 lb 11 oz (106 kg)    Assessment & Plan:   Problem List Items Addressed This Visit     Annual visit for general adult medical examination without abnormal findings - Primary  Hyperglycemia   Obesity, morbid, BMI 40.0-49.9 (HCC)   Other Visit Diagnoses       Anemia, unspecified type           I am having Ramatoulaye A. Ogden maintain her ipratropium-albuterol , albuterol , budesonide -formoterol , fluticasone , cetirizine , budesonide , amLODipine , and Wegovy .  No  orders of the defined types were placed in this encounter.

## 2024-05-31 NOTE — Assessment & Plan Note (Signed)
 Hx Anemia. Update CBC, Iron Panel, B12 and Folate.

## 2024-06-02 ENCOUNTER — Ambulatory Visit (INDEPENDENT_AMBULATORY_CARE_PROVIDER_SITE_OTHER): Admitting: Student

## 2024-06-02 DIAGNOSIS — R739 Hyperglycemia, unspecified: Secondary | ICD-10-CM

## 2024-06-02 DIAGNOSIS — I10 Essential (primary) hypertension: Secondary | ICD-10-CM

## 2024-06-02 DIAGNOSIS — Z124 Encounter for screening for malignant neoplasm of cervix: Secondary | ICD-10-CM

## 2024-06-02 DIAGNOSIS — Z0000 Encounter for general adult medical examination without abnormal findings: Secondary | ICD-10-CM

## 2024-06-02 DIAGNOSIS — D649 Anemia, unspecified: Secondary | ICD-10-CM

## 2024-06-02 DIAGNOSIS — J45909 Unspecified asthma, uncomplicated: Secondary | ICD-10-CM

## 2024-06-02 NOTE — Assessment & Plan Note (Signed)
 hgba1c acceptable, minimize simple carbs. Increase exercise as tolerated. Continue current meds

## 2024-06-02 NOTE — Assessment & Plan Note (Signed)
 Patient encouraged to maintain heart healthy diet, regular exercise, adequate sleep. Consider daily probiotics. Take medications as prescribed

## 2024-06-02 NOTE — Assessment & Plan Note (Signed)
 Well controlled, no changes to meds. Encouraged heart healthy diet such as the DASH diet and exercise as tolerated.

## 2024-06-02 NOTE — Assessment & Plan Note (Signed)
 Encouraged DASH or MIND diet, decrease po intake and increase exercise as tolerated. Needs 7-8 hours of sleep nightly. Avoid trans fats, eat small, frequent meals every 4-5 hours with lean proteins, complex carbs and healthy fats. Minimize simple carbs, high fat foods and processed foods

## 2024-06-03 ENCOUNTER — Ambulatory Visit (INDEPENDENT_AMBULATORY_CARE_PROVIDER_SITE_OTHER): Admitting: Student

## 2024-06-03 ENCOUNTER — Encounter: Payer: Self-pay | Admitting: Student

## 2024-06-03 VITALS — BP 130/85 | HR 81 | Ht 66.0 in | Wt 294.4 lb

## 2024-06-03 DIAGNOSIS — Z6841 Body Mass Index (BMI) 40.0 and over, adult: Secondary | ICD-10-CM

## 2024-06-03 DIAGNOSIS — I10 Essential (primary) hypertension: Secondary | ICD-10-CM | POA: Diagnosis not present

## 2024-06-03 DIAGNOSIS — R739 Hyperglycemia, unspecified: Secondary | ICD-10-CM

## 2024-06-03 DIAGNOSIS — J45909 Unspecified asthma, uncomplicated: Secondary | ICD-10-CM | POA: Diagnosis not present

## 2024-06-03 DIAGNOSIS — Z0000 Encounter for general adult medical examination without abnormal findings: Secondary | ICD-10-CM

## 2024-06-03 MED ORDER — WEGOVY 0.5 MG/0.5ML ~~LOC~~ SOAJ
0.5000 mg | SUBCUTANEOUS | 2 refills | Status: AC
Start: 2024-06-03 — End: ?

## 2024-06-03 NOTE — Progress Notes (Addendum)
 Subjective:     Patient ID: Brandy [name], female    DOB: August 30, 1996, 28 y.o.   MRN: [id number]  No chief complaint on file.   HPI Brandy [name] presents for FU chronic conditions and annual physical  Brandy [name] a 28 y.o.  female presents today for a complete physical exam. Pt reports consuming a general diet. Trying to incorporate more protien in her diet, Wegovy  has been helping with food cravings. Pt generally feels fairly well. Reports sleeping fairly well, getting 7 hours per night Does not have additional problems to discuss today.   New Surgeries or Family Hx: Denies Habits: ETOH- very minimal  Dentist- goes annually Vision: Follows with Optometrist, wears glasses  Psx-cholecystectomy  Follows with Pulmonology  HTN Amlodipine  5 mg daily Home BP: 130s SBP/ 60-70s DBP Reports no adverse Ses  Hx Asthma Albuterol  PRN, budesonide - fomoterol (SYMBICORT )- 2 puffs daily, uncontrolled Using albuterol  daily 2-3 times, Pulmicort  nebulizer Reports taking medications as prescribed, recently obtained new Pulmicort  nebulizer  Reports no recent exacerbations- scheduled for Pulmonology visit  Allergy Zyrtec  10 mg daily, Flonase   Obesity Semeglutide (Wegovy ) 0.25 mg, reports no adverse SEs with medication Hsas lost about 6 pounds over the past few weeks but somewhat plateaud  Reports taking medications as prescribed Weight: 294 lb 6.4 oz (133.5 kg)    Patient denies fever, chills, SOB, CP, palpitations, dyspnea, edema, HA, vision changes, N/V/D, abdominal pain, urinary symptoms, rash, and recent illness or hospitalizations.    History of Present Illness              Health Maintenance Due  Topic Date Due   Pneumococcal Vaccine (1 of 2 - PCV) Never done   Cervical Cancer Screening (Pap smear)  Never done   HPV VACCINES (2 - Risk 3-dose series) 09/03/2018   DTaP/Tdap/Td (7 - Td or Tdap) 03/14/2019   INFLUENZA VACCINE  04/30/2024   COVID-19 Vaccine (1 - 2024-25  season) Never done    Past Medical History:  Diagnosis Date   ADHD (attention deficit hyperactivity disorder)    Allergy    Asthma    Chlamydia 07/15/2017   Depression    GERD (gastroesophageal reflux disease)    Hypertension    My blood pressure has been high the last few months   Trichomonal vaginitis 07/15/2017    Past Surgical History:  Procedure Laterality Date   CHOLECYSTECTOMY N/A 11/17/2015   Procedure: LAPAROSCOPIC CHOLECYSTECTOMY ATTEMPTED INTRAOPERATIVE CHOLANGIOGRAM ;  Surgeon: Debby Shipper, MD;  Location: [facility] OR;  Service: General;  Laterality: N/A;    Family History  Problem Relation Age of Onset   Asthma Mother    Depression Mother    Asthma Sister    ADD / ADHD Sister    Diabetes Maternal Grandfather    Heart disease Maternal Grandfather     Social History   Socioeconomic History   Marital status: Single    Spouse name: Not on file   Number of children: 0   Years of education: Not on file   Highest education level: Associate degree: academic program  Occupational History   Not on file  Tobacco Use   Smoking status: Never   Smokeless tobacco: Never  Vaping Use   Vaping status: Never Used  Substance and Sexual Activity   Alcohol use: Yes   Drug use: Never   Sexual activity: Yes    Birth control/protection: None  Other Topics Concern   Not on file  Social History Narrative  Not on file   Social Drivers of Health   Financial Resource Strain: Low Risk  (05/05/2024)   Overall Financial Resource Strain (CARDIA)    Difficulty of Paying Living Expenses: Not very hard  Food Insecurity: Food Insecurity Present (05/05/2024)   Hunger Vital Sign    Worried About Running Out of Food in the Last Year: Sometimes true    Ran Out of Food in the Last Year: Sometimes true  Transportation Needs: No Transportation Needs (05/05/2024)   PRAPARE - Administrator, Civil Service (Medical): No    Lack of Transportation (Non-Medical): No  Physical  Activity: Insufficiently Active (05/05/2024)   Exercise Vital Sign    Days of Exercise per Week: 2 days    Minutes of Exercise per Session: 30 min  Stress: Stress Concern Present (05/05/2024)   [employer] of Occupational Health - Occupational Stress Questionnaire    Feeling of Stress: To some extent  Social Connections: Unknown (05/05/2024)   Social Connection and Isolation Panel    Frequency of Communication with Friends and Family: More than three times a week    Frequency of Social Gatherings with Friends and Family: Twice a week    Attends Religious Services: 1 to 4 times per year    Active Member of [employer] or Organizations: No    Attends Engineer, structural: Not on file    Marital Status: Patient declined  Catering manager Violence: Not on file    Outpatient Medications Prior to Visit  Medication Sig Dispense Refill   albuterol  (VENTOLIN  HFA) 108 (90 Base) MCG/ACT inhaler Inhale 1-2 puffs into the lungs every 6 (six) hours as needed for wheezing or shortness of breath. 18 g 0   amLODipine  (NORVASC ) 5 MG tablet Take 1 tablet (5 mg total) by mouth daily. 60 tablet 0   budesonide  (PULMICORT ) 0.5 MG/2ML nebulizer solution Take 2 mLs (0.5 mg total) by nebulization daily. 150 mL 0   budesonide -formoterol  (SYMBICORT ) 160-4.5 MCG/ACT inhaler Inhale 2 puffs into the lungs 2 (two) times daily. 30.6 g 0   cetirizine  (ZYRTEC ) 10 MG tablet Take 1 tablet (10 mg total) by mouth daily. (Patient not taking: Reported on 05/05/2024) 30 tablet 0   fluticasone  (FLONASE ) 50 MCG/ACT nasal spray Place 2 sprays into both nostrils daily. 16 g 0   ipratropium-albuterol  (DUONEB) 0.5-2.5 (3) MG/3ML SOLN Take 3 mLs by nebulization every 6 (six) hours as needed. 360 mL 3   semaglutide -weight management (WEGOVY ) 0.25 MG/0.5ML SOAJ SQ injection Inject 0.25 mg into the skin once a week. After 4 weeks, can increase to 0.5 mg weekly for 4 weeks, then 1 mg weekly for 4 weeks 2 mL 3   No facility-administered  medications prior to visit.    No Known Allergies  ROS    See HPI Objective:    Physical Exam Constitutional:      General: She is not in acute distress.    Appearance: She is obese. She is not ill-appearing, toxic-appearing or diaphoretic.  HENT:     Head: Normocephalic and atraumatic.     Right Ear: Tympanic membrane, ear canal and external ear normal.     Left Ear: Tympanic membrane, ear canal and external ear normal.     Nose: Nose normal. No congestion.     Comments: L nasal poly    Mouth/Throat:     Mouth: Mucous membranes are moist.     Pharynx: Oropharynx is clear.  Eyes:     Extraocular Movements: Extraocular  movements intact.     Right eye: Normal extraocular motion.     Left eye: Normal extraocular motion.     Conjunctiva/sclera: Conjunctivae normal.     Pupils: Pupils are equal, round, and reactive to light.  Neck:     Thyroid: No thyroid mass or thyromegaly.     Vascular: No carotid bruit or JVD.  Cardiovascular:     Rate and Rhythm: Normal rate and regular rhythm.     Pulses: Normal pulses.          Radial pulses are 2+ on the right side and 2+ on the left side.       Dorsalis pedis pulses are 2+ on the right side and 2+ on the left side.     Heart sounds: Normal heart sounds, S1 normal and S2 normal. No murmur heard.    No friction rub. No gallop.  Pulmonary:     Effort: Pulmonary effort is normal. No respiratory distress.     Breath sounds: Normal breath sounds.  Abdominal:     General: Bowel sounds are normal. There is no distension.     Palpations: Abdomen is soft.     Tenderness: There is no abdominal tenderness. There is no guarding.  Musculoskeletal:        General: Normal range of motion.     Cervical back: Full passive range of motion without pain and normal range of motion. No edema or erythema.     Right lower leg: No edema.     Left lower leg: No edema.  Lymphadenopathy:     Cervical: No cervical adenopathy.  Skin:    General: Skin is  warm and dry.     Capillary Refill: Capillary refill takes less than 2 seconds.  Neurological:     General: No focal deficit present.     Mental Status: She is alert and oriented to person, place, and time.     Cranial Nerves: No cranial nerve deficit.     Motor: No weakness.     Coordination: Coordination normal.     Gait: Gait normal.     Deep Tendon Reflexes: Reflexes normal.  Psychiatric:        Mood and Affect: Mood normal.        Behavior: Behavior normal.        Thought Content: Thought content normal.      BP 130/85   Pulse 81   Ht 5' 6 (1.676 m)   Wt 294 lb 6.4 oz (133.5 kg)   BMI 47.52 kg/m  Wt Readings from Last 3 Encounters:  06/03/24 294 lb 6.4 oz (133.5 kg)  05/05/24 (!) 300 lb 9.6 oz (136.4 kg)  12/26/23 290 lb (131.5 kg)    Assessment & Plan:   Problem List Items Addressed This Visit     Annual visit for general adult medical examination without abnormal findings - Primary   Patient encouraged to maintain heart healthy diet, regular exercise, adequate sleep. Consider daily probiotics. Take medications as prescribed.        Hyperglycemia   hgba1c acceptable, minimize simple carbs. Increase exercise as tolerated. Continue current meds       Morbid obesity with BMI of 45.0-49.9, adult (HCC)   Increase Wegovy  to 0.5 mg weekly injection.  Encouraged 150 minutes/week of moderate aerobic exercise and strength training twice weekly.  Discussed behavioral changes and goal setting; patient encouraged to track progress. Educated on MyPlate guidelines, for healthy balanced meals that includes portions of fruits, vegetables,  whole grains, lean protein, and low-fat dairy to support healthy weight loss. Consider referral to nutritionist, or weight management program if no improvement.         Relevant Medications   semaglutide -weight management (WEGOVY ) 0.5 MG/0.5ML SOAJ SQ injection   Primary hypertension   Well controlled, no changes to meds. Encouraged heart  healthy diet such as the DASH diet and exercise as tolerated.        Uncontrolled asthma   Denies recent exacerbations. Patient was able to get  new nebulizer, asthma has bene more controlled.   She has upcoming appointment with pulmonology.        Portions of this note were dictated using DRAGON voice recognition software. Please disregard any errors in transcription.      FU 3 months  I have discontinued Kaija A. Silber's Wegovy . I am also having her start on Wegovy . Additionally, I am having her maintain her ipratropium-albuterol , albuterol , budesonide -formoterol , fluticasone , cetirizine , budesonide , and amLODipine .  Meds ordered this encounter  Medications   semaglutide -weight management (WEGOVY ) 0.5 MG/0.5ML SOAJ SQ injection    Sig: Inject 0.5 mg into the skin once a week.    Dispense:  2 mL    Refill:  2    Supervising Provider:   DOMENICA BLACKBIRD A [[id number]]

## 2024-06-03 NOTE — Progress Notes (Signed)
 Error.  Pt no show

## 2024-06-03 NOTE — Assessment & Plan Note (Signed)
 Patient encouraged to maintain heart healthy diet, regular exercise, adequate sleep. Consider daily probiotics. Take medications as prescribed

## 2024-06-03 NOTE — Assessment & Plan Note (Signed)
 Well controlled, no changes to meds. Encouraged heart healthy diet such as the DASH diet and exercise as tolerated.

## 2024-06-03 NOTE — Assessment & Plan Note (Signed)
 Increase Wegovy  to 0.5 mg weekly injection.  Encouraged 150 minutes/week of moderate aerobic exercise and strength training twice weekly.  Discussed behavioral changes and goal setting; patient encouraged to track progress. Educated on MyPlate guidelines, for healthy balanced meals that includes portions of fruits, vegetables, whole grains, lean protein, and low-fat dairy to support healthy weight loss. Consider referral to nutritionist, or weight management program if no improvement.

## 2024-06-03 NOTE — Assessment & Plan Note (Signed)
 hgba1c acceptable, minimize simple carbs. Increase exercise as tolerated. Continue current meds

## 2024-06-03 NOTE — Assessment & Plan Note (Signed)
 Denies recent exacerbations. Patient was able to get  new nebulizer, asthma has bene more controlled.   She has upcoming appointment with pulmonology.

## 2024-06-07 ENCOUNTER — Ambulatory Visit: Admitting: Internal Medicine

## 2024-06-07 ENCOUNTER — Encounter: Payer: Self-pay | Admitting: Internal Medicine

## 2024-06-07 VITALS — BP 133/84 | HR 93 | Temp 98.5°F | Ht 65.0 in | Wt 296.0 lb

## 2024-06-07 DIAGNOSIS — J4541 Moderate persistent asthma with (acute) exacerbation: Secondary | ICD-10-CM

## 2024-06-07 DIAGNOSIS — Z886 Allergy status to analgesic agent status: Secondary | ICD-10-CM

## 2024-06-07 DIAGNOSIS — J4550 Severe persistent asthma, uncomplicated: Secondary | ICD-10-CM

## 2024-06-07 DIAGNOSIS — J339 Nasal polyp, unspecified: Secondary | ICD-10-CM

## 2024-06-07 LAB — POCT EXHALED NITRIC OXIDE: FeNO level (ppb): 49

## 2024-06-07 MED ORDER — BUDESONIDE-FORMOTEROL FUMARATE 160-4.5 MCG/ACT IN AERO: 2.0000 | INHALATION_SPRAY | Freq: Two times a day (BID) | RESPIRATORY_TRACT | 11 refills | Status: DC

## 2024-06-07 NOTE — Patient Instructions (Signed)
 It was a pleasure to see you today!  Please schedule follow up with myself in 2 months.  If my schedule is not open yet, we will contact you with a reminder closer to that time. Please call [phone] if you haven't heard from us  a month before, and always call us  sooner if issues or concerns arise. You can also send us  a message through MyChart, but but aware that this is not to be used for urgent issues and it may take up to 5-7 days to receive a reply. Please be aware that you will likely be able to view your results before I have a chance to respond to them. Please give us  5 business days to respond to any non-urgent results.     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 You should take Symbicort  two puffs in the morning and two puffs at night every day. Continue using your albuterol  inhaler as needed for quick relief. Avoid NSAIDs as they can worsen your symptoms. We are considering starting you on biologic therapy to reduce your need for steroids and inflammation. A breathing test will be done to check for allergic inflammation. Your Symbicort  prescription has been refilled.  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 This condition is likely making your asthma worse. Continue using Flonase , one spray in each nostril twice daily. We discussed the proper way to use Flonase . Biologic therapy is also being considered to help improve your symptoms.  INSTRUCTIONS:  Please follow up for a breathing test to assess markers of allergic inflammation. We will also initiate biologic therapy once it is approved by your insurance.

## 2024-06-07 NOTE — Progress Notes (Signed)
 [name]    [id number]    Dec 31, 1995  Primary Care Physician:Yacopino, Harlene CROME, NP  Referring Physician: Wheeler Harlene CROME, NP [address], [address] [place],  [place] [address] Reason for Consultation: asthma Date of Consultation: 06/07/2024  Chief complaint:   Chief Complaint  Patient presents with   Consult    Asthma , stopped up nose all the time      HPI:  Discussed the use of AI scribe software for clinical note transcription with the patient, who gave verbal consent to proceed.  History of Present Illness Brandy [name] is a 28 year old female with asthma who presents with worsening asthma symptoms and nasal congestion.   Her asthma, diagnosed two to three years ago following consecutive COVID-19 infections, has worsened significantly after a severe illness about a year ago. This has necessitated six courses of steroids in the past year, contributing to a 50-pound weight gain. Current medications include an albuterol  inhaler, Symbicort  (which she has been taking daily at least), and a nebulizer with Pulmicort  and Duoneb solutions. She uses the Pulmicort  nebulizer only during severe attacks and the albuterol  inhaler every other day due to concerns about high blood pressure. Asthma attacks occur primarily at night, triggered by nasal congestion and drainage, sometimes leading to vomiting.  She experiences severe nasal congestion and a constant sensation of 'soft up nose', leading to anosmia. Various treatments have been attempted, including Claritin , which initially provided relief but subsequently exacerbated her symptoms, and Flonase , which has been helping in recent weeks.  She has a family history of asthma in her mother and sister. She does not smoke or use recreational drugs. She works as a Regulatory affairs officer, often in outdoor settings, and notes that heat exacerbates her asthma symptoms.   Social history:  Occupation: [employer], [employer] for brand  promoter Exposures: lives at home independently.  Smoking history: never smoker  Social History   Occupational History   Not on file  Tobacco Use   Smoking status: Never   Smokeless tobacco: Never  Vaping Use   Vaping status: Never Used  Substance and Sexual Activity   Alcohol use: Yes   Drug use: Never   Sexual activity: Yes    Birth control/protection: None    Relevant family history:  Family History  Problem Relation Age of Onset   Asthma Mother    Depression Mother    Asthma Sister    ADD / ADHD Sister    Diabetes Maternal Grandfather    Heart disease Maternal Grandfather     Past Medical History:  Diagnosis Date   ADHD (attention deficit hyperactivity disorder)    Allergy    Asthma    Chlamydia 07/15/2017   Depression    GERD (gastroesophageal reflux disease)    Hypertension    My blood pressure has been high the last few months   Trichomonal vaginitis 07/15/2017    Past Surgical History:  Procedure Laterality Date   CHOLECYSTECTOMY N/A 11/17/2015   Procedure: LAPAROSCOPIC CHOLECYSTECTOMY ATTEMPTED INTRAOPERATIVE CHOLANGIOGRAM ;  Surgeon: Brandy Shipper, MD;  Location: [facility] OR;  Service: General;  Laterality: N/A;     Physical Exam: Blood pressure 133/84, pulse 93, temperature 98.5 F (36.9 C), temperature source Temporal, height 5' 5 (1.651 m), weight 296 lb (134.3 kg), SpO2 98%. Gen:      No acute distress ENT:  left nasal polyp, mucus membranes moist +cobblestoning Lungs:    No increased  respiratory effort, symmetric chest wall excursion, clear to auscultation bilaterally, no wheezes or crackles CV:         Regular rate and rhythm; no murmurs, rubs, or gallops.  No pedal edema Abd:      + bowel sounds; soft, non-tender; no distension MSK: no acute synovitis of DIP or PIP joints, no mechanics hands.  Skin:      Warm and dry; no rashes Neuro: normal speech, no focal facial asymmetry Psych: alert and oriented x3, normal mood and affect   Data  Reviewed/Medical Decision Making:  Independent interpretation of tests: Imaging:  Review of patient's ct angio may 2025 images revealed negative PE study, no acute pulmonary findings. The patient's images have been independently reviewed by me.    PFTs:  Labs:  Lab Results  Component Value Date   NA 133 (L) 02/08/2023   K 3.5 02/08/2023   CO2 22 02/08/2023   GLUCOSE 128 (H) 02/08/2023   BUN 9 02/08/2023   CREATININE 0.55 02/08/2023   CALCIUM 8.0 (L) 02/08/2023   GFRNONAA >60 02/08/2023   Lab Results  Component Value Date   WBC 9.5 02/08/2023   HGB 10.4 (L) 02/08/2023   HCT 34.8 (L) 02/08/2023   MCV 77.3 (L) 02/08/2023   PLT 464 (H) 02/08/2023    Immunization status:  Immunization History  Administered Date(s) Administered   DTaP 12/26/1997, 03/07/1998, 06/19/1998, 11/20/1998   Dtap, Unspecified 08/26/2001   HIB, Unspecified 12/26/1997, 03/07/1998, 11/20/1998   HPV 9-valent 08/06/2018   Hep A, Unspecified 03/30/2007, 08/29/2008   Hep B, Unspecified 1996/10/06, 09/26/1997, 06/19/1998   IPV 12/26/1997, 03/07/1998, 06/19/1998   Influenza Nasal 09/21/2007, 08/29/2008, 06/27/2009, 09/17/2010, 09/07/2011   Influenza,Quad,Nasal, Live 11/18/2012, 09/08/2014   Influenza,inj,Quad PF,6+ Mos 07/15/2017, 08/06/2018   Influenza-Unspecified 07/30/2004, 09/09/2005, 08/25/2006   MMR 10/09/1998, 08/26/2001   Meningococcal Conjugate 03/13/2010   Novel Infuenza-h1n1-09 07/25/2008   Polio, Unspecified 08/26/2001   Tdap 03/13/2009   Varicella 10/09/1998, 03/30/2007     I reviewed prior external note(s) from urgent care/ED  I reviewed the result(s) of the labs and imaging as noted above.   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  Biologics considered to reduce steroid dependency and inflammation. - Instructed  to continuing to take Symbicort  two puffs in the morning and two puffs at night daily. - Continue albuterol  inhaler as needed for rescue. - Avoid NSAIDs due to sensitivity. - Initiate biologic therapy pending insurance approval. - Feno today 49 ppb which is elevated and consistent with TH2 inflammation  - Refill Symbicort  prescription. - can add montelukast, additional anti-histamines at future visit potentially  Nasal polyp, left side with chronic allergic rhinitis and anosmia Left nasal polyp causing obstruction and anosmia, associated with chronic allergic rhinitis. Likely exacerbating asthma. Biologics expected to improve control. - Continue Flonase , one spray each side of the nose twice daily. - Educated on proper Flonase  administration technique. - Initiate biologic therapy pending insurance approval - new start dupixent    Return to Care: Return in about 2 months (around 08/07/2024).  Verdon Gore, MD Pulmonary and Critical Care Medicine  HealthCare Office:[phone]  CC: Brandy Harlene CROME, NP

## 2024-06-08 ENCOUNTER — Telehealth: Payer: Self-pay

## 2024-06-08 ENCOUNTER — Encounter: Payer: Self-pay | Admitting: Internal Medicine

## 2024-06-08 ENCOUNTER — Other Ambulatory Visit (INDEPENDENT_AMBULATORY_CARE_PROVIDER_SITE_OTHER)

## 2024-06-08 ENCOUNTER — Ambulatory Visit: Payer: Self-pay | Admitting: Student

## 2024-06-08 ENCOUNTER — Other Ambulatory Visit (HOSPITAL_COMMUNITY): Payer: Self-pay

## 2024-06-08 DIAGNOSIS — J339 Nasal polyp, unspecified: Secondary | ICD-10-CM

## 2024-06-08 DIAGNOSIS — D649 Anemia, unspecified: Secondary | ICD-10-CM

## 2024-06-08 DIAGNOSIS — J4550 Severe persistent asthma, uncomplicated: Secondary | ICD-10-CM

## 2024-06-08 DIAGNOSIS — D509 Iron deficiency anemia, unspecified: Secondary | ICD-10-CM

## 2024-06-08 DIAGNOSIS — Z0000 Encounter for general adult medical examination without abnormal findings: Secondary | ICD-10-CM | POA: Diagnosis not present

## 2024-06-08 DIAGNOSIS — R739 Hyperglycemia, unspecified: Secondary | ICD-10-CM

## 2024-06-08 DIAGNOSIS — J45909 Unspecified asthma, uncomplicated: Secondary | ICD-10-CM

## 2024-06-08 LAB — COMPREHENSIVE METABOLIC PANEL WITH GFR
ALT: 11 U/L (ref 0–35)
AST: 15 U/L (ref 0–37)
Albumin: 4 g/dL (ref 3.5–5.2)
Alkaline Phosphatase: 102 U/L (ref 39–117)
BUN: 7 mg/dL (ref 6–23)
CO2: 26 meq/L (ref 19–32)
Calcium: 9.3 mg/dL (ref 8.4–10.5)
Chloride: 102 meq/L (ref 96–112)
Creatinine, Ser: 0.58 mg/dL (ref 0.40–1.20)
GFR: 124.57 mL/min (ref >60.00)
Glucose, Bld: 90 mg/dL (ref 70–99)
Potassium: 4.1 meq/L (ref 3.5–5.1)
Sodium: 136 meq/L (ref 135–145)
Total Bilirubin: 0.3 mg/dL (ref 0.2–1.2)
Total Protein: 7.2 g/dL (ref 6.0–8.3)

## 2024-06-08 LAB — LIPID PANEL
Cholesterol: 166 mg/dL (ref 0–200)
HDL: 44.3 mg/dL (ref >39.00)
LDL Cholesterol: 101 mg/dL — ABNORMAL HIGH (ref 0–99)
NonHDL: 122.15
Total CHOL/HDL Ratio: 4
Triglycerides: 108 mg/dL (ref 0.0–149.0)
VLDL: 21.6 mg/dL (ref 0.0–40.0)

## 2024-06-08 LAB — CBC WITH DIFFERENTIAL/PLATELET
Basophils Absolute: 0.1 K/uL (ref 0.0–0.1)
Basophils Relative: 0.7 % (ref 0.0–3.0)
Eosinophils Absolute: 0.4 K/uL (ref 0.0–0.7)
Eosinophils Relative: 4.2 % (ref 0.0–5.0)
HCT: 34.6 % — ABNORMAL LOW (ref 36.0–46.0)
Hemoglobin: 10.8 g/dL — ABNORMAL LOW (ref 12.0–15.0)
Lymphocytes Relative: 24.7 % (ref 12.0–46.0)
Lymphs Abs: 2.5 K/uL (ref 0.7–4.0)
MCHC: 31.3 g/dL (ref 30.0–36.0)
MCV: 74.4 fl — ABNORMAL LOW (ref 78.0–100.0)
Monocytes Absolute: 0.7 K/uL (ref 0.1–1.0)
Monocytes Relative: 6.8 % (ref 3.0–12.0)
Neutro Abs: 6.3 K/uL (ref 1.4–7.7)
Neutrophils Relative %: 63.6 % (ref 43.0–77.0)
Platelets: 489 K/uL — ABNORMAL HIGH (ref 150.0–400.0)
RBC: 4.64 Mil/uL (ref 3.87–5.11)
RDW: 15.9 % — ABNORMAL HIGH (ref 11.5–15.5)
WBC: 10 K/uL (ref 4.0–10.5)

## 2024-06-08 LAB — HEMOGLOBIN A1C: Hgb A1c MFr Bld: 6.5 % (ref 4.6–6.5)

## 2024-06-08 LAB — TSH: TSH: 0.88 u[IU]/mL (ref 0.35–5.50)

## 2024-06-08 LAB — B12 AND FOLATE PANEL
Folate: 11.7 ng/mL (ref >5.9)
Vitamin B-12: 644 pg/mL (ref 211–911)

## 2024-06-08 MED ORDER — DUPIXENT 300 MG/2ML ~~LOC~~ SOAJ
300.0000 mg | SUBCUTANEOUS | 2 refills | Status: DC
  Filled 2024-06-18: qty 4, 28d supply, fill #0
  Filled 2024-07-13: qty 4, 28d supply, fill #1
  Filled 2024-08-04 – 2024-08-05 (×2): qty 4, 28d supply, fill #2

## 2024-06-08 NOTE — Telephone Encounter (Signed)
 ATC patient for new start DPX scheduling. LVM with contact number to return call.   Aleck Puls, PharmD, BCPS Clinical Pharmacist  [facility]

## 2024-06-08 NOTE — Telephone Encounter (Signed)
Please advise on prior auth.

## 2024-06-08 NOTE — Telephone Encounter (Signed)
 Received notification from Cc'd chart to initiate prior auth for Dupxient. Submitted a Prior Authorization request to [employer] for DUPIXENT  via CoverMyMeds. Will update once we receive a response.  Key: [id number]

## 2024-06-08 NOTE — Telephone Encounter (Signed)
 Received notification from [employer] regarding a prior authorization for DUPIXENT . Authorization has been APPROVED from 06/08/24 to 12/05/24. Approval letter sent to scan center.  Per test claim, copay for 28 days supply is $4  Authorization # [id number] Phone # [phone]

## 2024-06-08 NOTE — Telephone Encounter (Signed)
 Patient returned call. New start Dupixent  scheduled 06/09/24 using sample.   She will look out for phone call from [place] or [place] for onboarding.   Aleck Puls, PharmD, BCPS Clinical Pharmacist  [facility]

## 2024-06-09 ENCOUNTER — Ambulatory Visit (INDEPENDENT_AMBULATORY_CARE_PROVIDER_SITE_OTHER)

## 2024-06-09 DIAGNOSIS — Z7189 Other specified counseling: Secondary | ICD-10-CM | POA: Diagnosis not present

## 2024-06-09 DIAGNOSIS — J4550 Severe persistent asthma, uncomplicated: Secondary | ICD-10-CM | POA: Diagnosis not present

## 2024-06-09 DIAGNOSIS — J339 Nasal polyp, unspecified: Secondary | ICD-10-CM

## 2024-06-09 LAB — IRON,TIBC AND FERRITIN PANEL
%SAT: 6 % — ABNORMAL LOW (ref 16–45)
Ferritin: 20 ng/mL (ref 16–154)
Iron: 23 ug/dL — ABNORMAL LOW (ref 40–190)
TIBC: 370 ug/dL (ref 250–450)

## 2024-06-09 MED ORDER — FERROUS SULFATE 324 MG PO TBEC: 324.0000 mg | DELAYED_RELEASE_TABLET | Freq: Every day | ORAL | Status: AC

## 2024-06-09 NOTE — Progress Notes (Signed)
 HPI Patient presents today to [facility] to see pharmacy team for Dupixent  new start.  Past medical history includes severe persistent asthma, nasal polyps, HTN, and GERD.   Seen by Dr. Meade on 06/07/24. Several courses of steroids in the last year, contributing to a 50-pound weight gain.  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   amLODipine  (NORVASC ) 5 MG tablet Take 1 tablet (5 mg total) by mouth daily.   budesonide  (PULMICORT ) 0.5 MG/2ML nebulizer solution Take 2 mLs (0.5 mg total) by nebulization daily.   budesonide -formoterol  (SYMBICORT ) 160-4.5 MCG/ACT inhaler Inhale 2 puffs into the lungs 2 (two) times daily.   cetirizine  (ZYRTEC ) 10 MG tablet Take 1 tablet (10 mg total) by mouth daily. (Patient not taking: Reported on 06/07/2024)   Dupilumab  (DUPIXENT ) 300 MG/2ML SOAJ Inject 300 mg into the skin every 14 (fourteen) days.   fluticasone  (FLONASE ) 50 MCG/ACT nasal spray Place 2 sprays into both nostrils daily.   ipratropium-albuterol  (DUONEB) 0.5-2.5 (3) MG/3ML SOLN Take 3 mLs by nebulization every 6 (six) hours as needed.   semaglutide -weight management (WEGOVY ) 0.5 MG/0.5ML SOAJ SQ injection Inject 0.5 mg into the skin once a week.   No facility-administered encounter medications on file  as of 06/09/2024.     Immunization History  Administered Date(s) Administered   DTaP 12/26/1997, 03/07/1998, 06/19/1998, 11/20/1998   Dtap, Unspecified 08/26/2001   HIB, Unspecified 12/26/1997, 03/07/1998, 11/20/1998   HPV 9-valent 08/06/2018   Hep A, Unspecified 03/30/2007, 08/29/2008   Hep B, Unspecified 07/02/1997, 09/26/1997, 06/19/1998   IPV 12/26/1997, 03/07/1998, 06/19/1998   Influenza Nasal 09/21/2007, 08/29/2008, 06/27/2009, 09/17/2010, 09/07/2011   Influenza,Quad,Nasal, Live 11/18/2012, 09/08/2014   Influenza,inj,Quad PF,6+ Mos 07/15/2017, 08/06/2018   Influenza-Unspecified 07/30/2004, 09/09/2005, 08/25/2006   MMR 10/09/1998, 08/26/2001   Meningococcal Conjugate 03/13/2010   Novel Infuenza-h1n1-09 07/25/2008   Polio, Unspecified 08/26/2001   Tdap 03/13/2009   Varicella 10/09/1998, 03/30/2007     PFTs     No data to display           Eosinophils Most recent blood eosinophil count was 400 cells/microL taken on 06/08/24.   IgE: not available for review  Assessment   Biologics training for dupilumab  (Dupixent )  Goals of therapy: Mechanism: human monoclonal IgG4 antibody that inhibits interleukin-4 and interleukin-13 cytokine-induced responses, including release of proinflammatory cytokines, chemokines, and IgE Reviewed that Dupixent  is add-on medication and patient must continue maintenance inhaler regimen. Response to therapy: may take 4 months to determine efficacy. Discussed that patients generally feel improvement sooner than 4 months.  Side effects: injection site reaction (6-18%), antibody development (5-16%), ophthalmic conjunctivitis (2-16%), transient blood eosinophilia (1-2%)  Dose: 600mg  at Week 0 (administered today in clinic) followed by 300mg  every 14 days thereafter  Administration/Storage:  Reviewed administration sites of thigh or abdomen (at least 2-3 inches away from abdomen). Reviewed the  upper arm is only appropriate if caregiver is  administering injection  Do not shake pen/syringe as this could lead to product foaming or precipitation. Do not use if solution is discolored or contains particulate matter or if window on prefilled pen is yellow (indicates pen has been used).  Reviewed storage of medication in refrigerator. Reviewed that Dupixent  can be stored at room temperature in unopened carton for up to 14 days.  Access: Approval of Dupixent  through: insurance  Patient self-administered Dupixent  300mg /48ml x 2 (total dose 600mg ) in left upper thigh (injection sites at least 2 inches away from the other) using sample  Dupixent  300mg /44mL autoinjector pen NDC: [phone] Lot: [id number] Expiration: 2025-06-29  Patient monitored for 30 minutes for adverse reaction.  Patient tolerated well. Patient denies itchiness and irritation at injection.  Medication Reconciliation  A drug regimen assessment was performed, including review of allergies, interactions, disease-state management, dosing and immunization history. Medications were reviewed with the patient, including name, instructions, indication, goals of therapy, potential side effects, importance of adherence, and safe use.  Drug interaction(s): none noted   PLAN Continue Dupixent  300mg  every 14 days.  Next dose is due 06/23/24 and every 14 days thereafter. Rx sent to: [facility]: [phone] .  Patient provided with pharmacy phone number. Continue maintenance inhaler regimen of: Symbicort  160-4.5 mcg/act inhaler (Inhale 2 puffs twice daily)  All questions encouraged and answered.  Instructed patient to reach out with any further questions or concerns.  Thank you for allowing pharmacy to participate in this patient's care.  This appointment required 45 minutes of patient care (this includes precharting, chart review, review of results, face-to-face care, etc.).

## 2024-06-09 NOTE — Patient Instructions (Addendum)
 Your next Dupixent  dose is due on 06/23/24 and every 14 days thereafter.   Keep Dupixent  in the refrigerator. If necessary, it can be stored at room temperature for no more than 14 days.   CONTINUE Symbicort  160-4.8mcg/act inhaler (Inhale 2 puffs into the lungs twice daily).   Your prescription will be shipped from [facility]. Their phone number is [phone].  Someone will call to schedule shipment and confirm address. They will mail your medication to your home.  You will need to be seen by your provider in 3 to 4 months to assess how Dupixent  is working for you. Please keep your follow-up appointment scheduled on 08/11/24 with Dr. Meade.   Stay up to date on all routine vaccines: influenza, pneumonia, COVID19, Shingles  How to manage an injection site reaction: Remember the 5 C's: COUNTER - leave on the counter at least 30 minutes but up to overnight to bring medication to room temperature. This may help prevent stinging COLD - place something cold (like an ice gel pack or cold water bottle) on the injection site just before cleansing with alcohol. This may help reduce pain CLARITIN  - use Claritin  (generic name is loratadine ) for the first two weeks of treatment or the day of, the day before, and the day after injecting. This will help to minimize injection site reactions CORTISONE CREAM - apply if injection site is irritated and itching CALL ME - if injection site reaction is bigger than the size of your fist, looks infected, blisters, or if you develop hives

## 2024-06-10 ENCOUNTER — Other Ambulatory Visit (HOSPITAL_COMMUNITY): Payer: Self-pay

## 2024-06-10 NOTE — Telephone Encounter (Signed)
 Symbicort PA

## 2024-06-17 ENCOUNTER — Other Ambulatory Visit: Payer: Self-pay

## 2024-06-18 ENCOUNTER — Other Ambulatory Visit: Payer: Self-pay

## 2024-06-18 NOTE — Progress Notes (Signed)
 Specialty Pharmacy Initial Fill Coordination Note  Brandy [name] is a 28 y.o. female contacted today regarding initial fill of specialty medication(s) Dupilumab  (Dupixent )   Patient requested Delivery   Delivery date: 06/22/24   Verified address: [address] [place], [place] [address]   Medication will be filled on 9/22.   Patient is aware of $4 copayment.

## 2024-06-18 NOTE — Progress Notes (Signed)
 Patient self-administered Dupixent  in clinic on 06/09/24.   Continue Dupixent  300mg  every 14 days.  Next dose is due 06/23/24 and every 14 days thereafter. Rx sent to: [facility]: [phone] .  Patient provided with pharmacy phone number. Continue maintenance inhaler regimen of: Symbicort  160-4.5 mcg/act inhaler (Inhale 2 puffs twice daily)   All questions encouraged and answered.  Instructed patient to reach out with any further questions or concerns.  Aleck Puls, PharmD, BCPS Clinical Pharmacist  [facility]

## 2024-06-21 ENCOUNTER — Other Ambulatory Visit: Payer: Self-pay

## 2024-06-30 ENCOUNTER — Encounter: Payer: Self-pay | Admitting: Internal Medicine

## 2024-06-30 DIAGNOSIS — J302 Other seasonal allergic rhinitis: Secondary | ICD-10-CM

## 2024-07-07 MED ORDER — FLUTICASONE PROPIONATE 50 MCG/ACT NA SUSP: 2.0000 | Freq: Every day | NASAL | 5 refills | Status: DC

## 2024-07-07 NOTE — Telephone Encounter (Signed)
 Are you okay with refilling flonase 

## 2024-07-07 NOTE — Telephone Encounter (Signed)
 That's fine it's refilled.

## 2024-07-07 NOTE — Addendum Note (Signed)
 Addended by: Bhargav Barbaro on: 07/07/2024 10:30 AM   Modules accepted: Orders

## 2024-07-12 ENCOUNTER — Encounter (INDEPENDENT_AMBULATORY_CARE_PROVIDER_SITE_OTHER): Payer: Self-pay

## 2024-07-12 ENCOUNTER — Encounter: Payer: Self-pay | Admitting: Student

## 2024-07-12 DIAGNOSIS — I10 Essential (primary) hypertension: Secondary | ICD-10-CM

## 2024-07-12 MED ORDER — AMLODIPINE BESYLATE 5 MG PO TABS: 5.0000 mg | ORAL_TABLET | Freq: Every day | ORAL | 0 refills | Status: AC

## 2024-07-13 ENCOUNTER — Other Ambulatory Visit: Payer: Self-pay

## 2024-07-13 ENCOUNTER — Other Ambulatory Visit (HOSPITAL_COMMUNITY): Payer: Self-pay

## 2024-07-13 NOTE — Progress Notes (Signed)
 Specialty Pharmacy Refill Coordination Note  Brandy [name] is a 28 y.o. female contacted today regarding refills of specialty medication(s) Dupilumab  (Dupixent )   Patient requested Delivery   Delivery date: 07/15/24   Verified address: [address] [place] [address]   Medication will be filled on 07/14/24.

## 2024-07-19 ENCOUNTER — Encounter: Payer: Self-pay | Admitting: Student

## 2024-08-04 ENCOUNTER — Other Ambulatory Visit (HOSPITAL_COMMUNITY): Payer: Self-pay

## 2024-08-05 ENCOUNTER — Other Ambulatory Visit: Payer: Self-pay

## 2024-08-05 ENCOUNTER — Encounter (INDEPENDENT_AMBULATORY_CARE_PROVIDER_SITE_OTHER): Payer: Self-pay

## 2024-08-05 ENCOUNTER — Other Ambulatory Visit (HOSPITAL_COMMUNITY): Payer: Self-pay

## 2024-08-06 ENCOUNTER — Other Ambulatory Visit: Payer: Self-pay

## 2024-08-06 ENCOUNTER — Other Ambulatory Visit: Payer: Self-pay | Admitting: Pharmacy Technician

## 2024-08-06 NOTE — Progress Notes (Signed)
 Specialty Pharmacy Refill Coordination Note  Brandy [name] is a 28 y.o. female contacted today regarding refills of specialty medication(s)  Dupilumab  (Dupixent     Patient requested (Patient-Rptd) Delivery   Delivery date: 08/11/2024 Verified address: (Patient-Rptd) [address] [place] [address]   Medication will be filled on:08/10/2024

## 2024-08-09 ENCOUNTER — Other Ambulatory Visit: Payer: Self-pay

## 2024-08-11 ENCOUNTER — Ambulatory Visit: Admitting: Internal Medicine

## 2024-08-25 ENCOUNTER — Ambulatory Visit

## 2024-08-25 VITALS — BP 136/85 | HR 96 | Temp 98.3°F | Ht 65.0 in | Wt 285.0 lb

## 2024-08-25 DIAGNOSIS — Z9109 Other allergy status, other than to drugs and biological substances: Secondary | ICD-10-CM | POA: Diagnosis not present

## 2024-08-25 DIAGNOSIS — Z6841 Body Mass Index (BMI) 40.0 and over, adult: Secondary | ICD-10-CM

## 2024-08-25 DIAGNOSIS — J4550 Severe persistent asthma, uncomplicated: Secondary | ICD-10-CM

## 2024-08-25 LAB — POCT EXHALED NITRIC OXIDE: FeNO level (ppb): 22 (ref ?–50)

## 2024-08-25 MED ORDER — MONTELUKAST SODIUM 10 MG PO TABS: 10.0000 mg | ORAL_TABLET | Freq: Every day | ORAL | 11 refills | Status: AC

## 2024-08-25 MED ORDER — ALBUTEROL SULFATE HFA 108 (90 BASE) MCG/ACT IN AERS: 1.0000 | INHALATION_SPRAY | Freq: Four times a day (QID) | RESPIRATORY_TRACT | 0 refills | Status: DC | PRN

## 2024-08-25 MED ORDER — SYMBICORT 160-4.5 MCG/ACT IN AERO: 2.0000 | INHALATION_SPRAY | Freq: Two times a day (BID) | RESPIRATORY_TRACT | 11 refills | Status: AC

## 2024-08-25 MED ORDER — IPRATROPIUM-ALBUTEROL 0.5-2.5 (3) MG/3ML IN SOLN: 3.0000 mL | Freq: Four times a day (QID) | RESPIRATORY_TRACT | 3 refills | Status: AC | PRN

## 2024-08-25 MED ORDER — FLUTICASONE PROPIONATE 50 MCG/ACT NA SUSP: 2.0000 | Freq: Every day | NASAL | 5 refills | Status: DC

## 2024-08-25 NOTE — Progress Notes (Signed)
 Pulmonology Office Visit   Subjective:  Patient ID: Brandy [name], female    DOB: 1996-01-16  MRN: [id number]  Referred by: Wheeler Harlene CROME, NP  CC:  Chief Complaint  Patient presents with   Consult   Asthma    Somewhat controlled     Asthma Her past medical history is significant for asthma.   Brandy [name] is a 28 y.o. female who is here to establish care with me She is a former patient of Dr Meade and is here to establish care with me.   Discussed the use of AI scribe software for clinical note transcription with the patient, who gave verbal consent to proceed.  History of Present Illness Brandy [name] is a 28 year old female with asthma who presents for follow-up of her asthma management.  Her asthma began around the age of 7 or 76, following her first COVID-19 infection, with a significant exacerbation after a subsequent infection. She did not have asthma as a child, although her mother and sister have asthma. Her asthma is triggered by strong perfumes, dust, smoke, and pollen.  Her symptoms have improved over time, particularly in the last two to three weeks since she started dupixent , although her allergies have become more sensitive, causing nasal stuffiness in the mornings and evenings. No mood changes or unusual thoughts.  Her current medications include Dupixent , a steroid inhaler (Symbicort ) used twice daily, and a nasal spray used twice daily. She has not needed to use her nebulizer or albuterol  since starting Dupixent . She previously used Zyrtec , which initially helped but later worsened her allergies. She has not tried Singulair  (montelukast ) before.  She has lost weight recently and is actively trying to lose more. She administers Dupixent  injections in her leg, alternating between legs.       ROS Review of symptoms negative except mentioned above    Allergies: Nsaids  Current Outpatient Medications:    amLODipine  (NORVASC ) 5 MG tablet, Take 1  tablet (5 mg total) by mouth daily., Disp: 90 tablet, Rfl: 0   budesonide -formoterol  (SYMBICORT ) 160-4.5 MCG/ACT inhaler, Inhale 2 puffs into the lungs in the morning and at bedtime., Disp: 30.6 g, Rfl: 11   Dupilumab  (DUPIXENT ) 300 MG/2ML SOAJ, Inject 300 mg into the skin every 14 (fourteen) days., Disp: 4 mL, Rfl: 2   montelukast  (SINGULAIR ) 10 MG tablet, Take 1 tablet (10 mg total) by mouth at bedtime., Disp: 30 tablet, Rfl: 11   Multiple Vitamin (MULTIVITAMIN) tablet, Take 1 tablet by mouth daily., Disp: , Rfl:    semaglutide -weight management (WEGOVY ) 0.5 MG/0.5ML SOAJ SQ injection, Inject 0.5 mg into the skin once a week., Disp: 2 mL, Rfl: 2   albuterol  (VENTOLIN  HFA) 108 (90 Base) MCG/ACT inhaler, Inhale 1-2 puffs into the lungs every 6 (six) hours as needed for wheezing or shortness of breath., Disp: 18 g, Rfl: 0   ferrous sulfate  324 MG TBEC, Take 1 tablet (324 mg total) by mouth daily. (Patient not taking: Reported on 08/25/2024), Disp: , Rfl:    fluticasone  (FLONASE ) 50 MCG/ACT nasal spray, Place 2 sprays into both nostrils daily., Disp: 16 g, Rfl: 5   ipratropium-albuterol  (DUONEB) 0.5-2.5 (3) MG/3ML SOLN, Take 3 mLs by nebulization every 6 (six) hours as needed., Disp: 360 mL, Rfl: 3 Past Medical History:  Diagnosis Date   ADHD (attention deficit hyperactivity disorder)    Allergy    Asthma    Chlamydia 07/15/2017   Depression    GERD (gastroesophageal reflux disease)  Hypertension    My blood pressure has been high the last few months   Trichomonal vaginitis 07/15/2017   Past Surgical History:  Procedure Laterality Date   CHOLECYSTECTOMY N/A 11/17/2015   Procedure: LAPAROSCOPIC CHOLECYSTECTOMY ATTEMPTED INTRAOPERATIVE CHOLANGIOGRAM ;  Surgeon: Debby Shipper, MD;  Location: [facility] OR;  Service: General;  Laterality: N/A;   Family History  Problem Relation Age of Onset   Asthma Mother    Depression Mother    Asthma Sister    ADD / ADHD Sister    Diabetes Maternal  Grandfather    Heart disease Maternal Grandfather    Social History   Socioeconomic History   Marital status: Single    Spouse name: Not on file   Number of children: 0   Years of education: Not on file   Highest education level: Associate degree: academic program  Occupational History   Not on file  Tobacco Use   Smoking status: Never   Smokeless tobacco: Never  Vaping Use   Vaping status: Never Used  Substance and Sexual Activity   Alcohol use: Yes   Drug use: Never   Sexual activity: Yes    Birth control/protection: None  Other Topics Concern   Not on file  Social History Narrative   Not on file   Social Drivers of Health   Financial Resource Strain: Low Risk  (05/05/2024)   Overall Financial Resource Strain (CARDIA)    Difficulty of Paying Living Expenses: Not very hard  Food Insecurity: Food Insecurity Present (05/05/2024)   Hunger Vital Sign    Worried About Running Out of Food in the Last Year: Sometimes true    Ran Out of Food in the Last Year: Sometimes true  Transportation Needs: No Transportation Needs (05/05/2024)   PRAPARE - Administrator, Civil Service (Medical): No    Lack of Transportation (Non-Medical): No  Physical Activity: Insufficiently Active (05/05/2024)   Exercise Vital Sign    Days of Exercise per Week: 2 days    Minutes of Exercise per Session: 30 min  Stress: Stress Concern Present (05/05/2024)   [employer] of Occupational Health - Occupational Stress Questionnaire    Feeling of Stress: To some extent  Social Connections: Unknown (05/05/2024)   Social Connection and Isolation Panel    Frequency of Communication with Friends and Family: More than three times a week    Frequency of Social Gatherings with Friends and Family: Twice a week    Attends Religious Services: 1 to 4 times per year    Active Member of [employer] or Organizations: No    Attends Engineer, Structural: Not on file    Marital Status: Patient declined   Catering Manager Violence: Not on file         Objective:  BP 136/85   Pulse 96   Temp 98.3 F (36.8 C) (Oral)   Ht 5' 5 (1.651 m)   Wt 285 lb (129.3 kg)   SpO2 96%   BMI 47.43 kg/m    Physical Exam Constitutional:      General: She is not in acute distress.    Appearance: Normal appearance.  HENT:     Mouth/Throat:     Mouth: Mucous membranes are moist.  Cardiovascular:     Rate and Rhythm: Normal rate.  Pulmonary:     Effort: No respiratory distress.     Breath sounds: No wheezing or rales.  Musculoskeletal:     Right lower leg: No edema.  Left lower leg: No edema.  Skin:    General: Skin is warm.  Neurological:     Mental Status: She is alert and oriented to person, place, and time.  Psychiatric:        Mood and Affect: Mood normal.     Diagnostic Review:    Pft     No data to display               Results LABS Eosinophils: 4.2% (05/2024) Eosinophil Absolute Count: 400 (05/2024)    Her eosinophil count was 4.2% with an absolute count of 400 in September, down from 7% and 700 the previous year.    Assessment & Plan:   Assessment & Plan Severe persistent asthma without complication (HCC) Symptoms improving since she was started on Dupixent .  Still has some sinus congestion Will recheck FeNo today, last lab reviewed Continue medications as below Added Singulair  today Orders:   ipratropium-albuterol  (DUONEB) 0.5-2.5 (3) MG/3ML SOLN; Take 3 mLs by nebulization every 6 (six) hours as needed.   albuterol  (VENTOLIN  HFA) 108 (90 Base) MCG/ACT inhaler; Inhale 1-2 puffs into the lungs every 6 (six) hours as needed for wheezing or shortness of breath.   budesonide -formoterol  (SYMBICORT ) 160-4.5 MCG/ACT inhaler; Inhale 2 puffs into the lungs in the morning and at bedtime.   fluticasone  (FLONASE ) 50 MCG/ACT nasal spray; Place 2 sprays into both nostrils daily.   Pulmonary function test; Future   POCT EXHALED NITRIC OXIDE    montelukast   (SINGULAIR ) 10 MG tablet; Take 1 tablet (10 mg total) by mouth at bedtime.  Environmental allergies I discussed FDA black box warning on singulair  including but not limited to risk of serious neuropsychiatric events like depression, anxiety, suicidal thoughts, hallucinations, memory problems. Patient is aware and is willing to try. Advised to alert us  and to stop singulair   if those Adverse effects are noticed. Patient is willing to try Singulair .  Singulair  ordered She had suboptimal response to antihistaminic in the past Orders:   montelukast  (SINGULAIR ) 10 MG tablet; Take 1 tablet (10 mg total) by mouth at bedtime.  BMI 45.0-49.9, adult (HCC) Congratulated on her attempts to lose weight Advised weight loss with exercise and diet control      Addendum: Feno is normal.  No results found for: NITRICOXIDE FeNO level (ppb)  Date/Time Value Ref Range Status  08/25/2024 03:13 PM 22 <25 - >50 Final   This result suggests improvement from before.    Thank you for the opportunity to take part in the care of Brandy [name]   Return in about 4 months (around 12/23/2024).   Dakayla Disanti Pleas, MD [facility] Office: [phone]

## 2024-08-25 NOTE — Patient Instructions (Addendum)
 VISIT SUMMARY: You came in today for a follow-up on your asthma management. Your asthma, which started after your first COVID-19 infection, has been improving, especially in the last few weeks. We discussed your current medications and made some adjustments to better manage your symptoms.  YOUR PLAN: ASTHMA WITH ALLERGIC RHINITIS AND NASAL POLYP: Your asthma, which is triggered by allergens like perfumes, dust, smoke, and pollen, has been improving. You also have a nasal polyp and elevated eosinophil levels, indicating an allergic component. -Continue using your Symbicort  inhaler twice daily. Please rinse your mouth after inhaler use.  -Continue using your nasal spray twice daily. -Continue with Dupixent  300 mg every 2 weeks for your nasal and lung symptoms. -Start taking Singulair  (montelukast ) for additional allergy management. Be aware of potential mental adverse effects and stop taking it if you notice any. -Your Duoneb prescription has been refilled for use as needed. -We will schedule a pulmonary function test before your next visit to confirm your asthma diagnosis. -Follow up in four months for reassessment and to review your pulmonary function test results.     Contains text generated by [employer].

## 2024-08-31 ENCOUNTER — Other Ambulatory Visit: Payer: Self-pay | Admitting: Internal Medicine

## 2024-08-31 ENCOUNTER — Other Ambulatory Visit: Payer: Self-pay

## 2024-08-31 DIAGNOSIS — J4550 Severe persistent asthma, uncomplicated: Secondary | ICD-10-CM

## 2024-09-01 ENCOUNTER — Other Ambulatory Visit: Payer: Self-pay

## 2024-09-01 MED ORDER — DUPIXENT 300 MG/2ML ~~LOC~~ SOAJ
300.0000 mg | SUBCUTANEOUS | 4 refills | Status: DC
  Filled 2024-09-01 – 2024-09-06 (×2): qty 4, 28d supply, fill #0
  Filled 2024-10-05: qty 4, 28d supply, fill #1

## 2024-09-01 NOTE — Telephone Encounter (Signed)
 Refill sent for DUPIXENT  to [facility]: [phone]   Dose: 300mg  [place] every 14 days  Last OV: 08/25/24 Provider: Dr. Pleas  Next OV: 12/13/24  Aleck Puls, PharmD, BCPS Clinical Pharmacist  [facility]

## 2024-09-02 DIAGNOSIS — J4550 Severe persistent asthma, uncomplicated: Secondary | ICD-10-CM | POA: Insufficient documentation

## 2024-09-02 NOTE — Assessment & Plan Note (Signed)
 Follows with Pulmonology On Symbicourt BID, albuterol  PRN Denies recent exacerbations

## 2024-09-02 NOTE — Assessment & Plan Note (Signed)
 Well controlled, no changes to meds. Encouraged heart healthy diet such as the DASH diet and exercise as tolerated.

## 2024-09-02 NOTE — Assessment & Plan Note (Signed)
 Recently started on Singular. Denies adverse Ses. Continue Signular and flonase   If symptoms persist or worsen, consider referral to allergist

## 2024-09-02 NOTE — Assessment & Plan Note (Signed)
 Encouraged DASH or MIND diet, decrease po intake and increase exercise as tolerated.  Avoid trans fats, eat small, frequent meals every 4-5 hours with lean proteins, complex carbs and healthy fats. Minimize simple carbs, high fat foods and processed foods.

## 2024-09-02 NOTE — Progress Notes (Unsigned)
 Subjective:     Patient ID: Brandy [name], female    DOB: June 15, 1996, 28 y.o.   MRN: [id number]  No chief complaint on file.   HPI Brandy [name] presents for FU chronic conditions and annual physical  Brandy [name] a 28 y.o.  female presents today for follow up.  Follows with Pulmonology  HTN Amlodipine  5 mg daily Home BP: 130s SBP/ 60-70s DBP Reports no adverse Ses  Hx Asthma Albuterol  PRN, budesonide - fomoterol (SYMBICORT )- 2 puffs daily, uncontrolled Using albuterol  daily 2-3 times, Pulmicort  nebulizer Reports taking medications as prescribed, recently obtained new Pulmicort  nebulizer  Reports no recent exacerbations- scheduled for Pulmonology visit  Allergy Singulair , Flonase   Reports taking medications as prescribed      Patient denies fever, chills, SOB, CP, palpitations, dyspnea, edema, HA, vision changes, N/V/D, abdominal pain, urinary symptoms, rash, and recent illness or hospitalizations.    History of Present Illness              Health Maintenance Due  Topic Date Due   Pneumococcal Vaccine (1 of 2 - PCV) Never done   Cervical Cancer Screening (Pap smear)  Never done   HPV VACCINES (2 - Risk 3-dose series) 09/03/2018   DTaP/Tdap/Td (7 - Td or Tdap) 03/14/2019   Influenza Vaccine  04/30/2024   COVID-19 Vaccine (1 - 2025-26 season) Never done    Past Medical History:  Diagnosis Date   ADHD (attention deficit hyperactivity disorder)    Allergy    Asthma    Chlamydia 07/15/2017   Depression    GERD (gastroesophageal reflux disease)    Hypertension    My blood pressure has been high the last few months   Trichomonal vaginitis 07/15/2017    Past Surgical History:  Procedure Laterality Date   CHOLECYSTECTOMY N/A 11/17/2015   Procedure: LAPAROSCOPIC CHOLECYSTECTOMY ATTEMPTED INTRAOPERATIVE CHOLANGIOGRAM ;  Surgeon: Debby Shipper, MD;  Location: [facility] OR;  Service: General;  Laterality: N/A;    Family History  Problem Relation Age of Onset    Asthma Mother    Depression Mother    Asthma Sister    ADD / ADHD Sister    Diabetes Maternal Grandfather    Heart disease Maternal Grandfather     Social History   Socioeconomic History   Marital status: Single    Spouse name: Not on file   Number of children: 0   Years of education: Not on file   Highest education level: Associate degree: academic program  Occupational History   Not on file  Tobacco Use   Smoking status: Never   Smokeless tobacco: Never  Vaping Use   Vaping status: Never Used  Substance and Sexual Activity   Alcohol use: Yes   Drug use: Never   Sexual activity: Yes    Birth control/protection: None  Other Topics Concern   Not on file  Social History Narrative   Not on file   Social Drivers of Health   Financial Resource Strain: Low Risk  (05/05/2024)   Overall Financial Resource Strain (CARDIA)    Difficulty of Paying Living Expenses: Not very hard  Food Insecurity: Food Insecurity Present (05/05/2024)   Hunger Vital Sign    Worried About Running Out of Food in the Last Year: Sometimes true    Ran Out of Food in the Last Year: Sometimes true  Transportation Needs: No Transportation Needs (05/05/2024)   PRAPARE - Transportation    Lack of Transportation (Medical): No    Lack of  Transportation (Non-Medical): No  Physical Activity: Insufficiently Active (05/05/2024)   Exercise Vital Sign    Days of Exercise per Week: 2 days    Minutes of Exercise per Session: 30 min  Stress: Stress Concern Present (05/05/2024)   [employer] of Occupational Health - Occupational Stress Questionnaire    Feeling of Stress: To some extent  Social Connections: Unknown (05/05/2024)   Social Connection and Isolation Panel    Frequency of Communication with Friends and Family: More than three times a week    Frequency of Social Gatherings with Friends and Family: Twice a week    Attends Religious Services: 1 to 4 times per year    Active Member of [employer] or Organizations:  No    Attends Engineer, Structural: Not on file    Marital Status: Patient declined  Catering Manager Violence: Not on file    Outpatient Medications Prior to Visit  Medication Sig Dispense Refill   albuterol  (VENTOLIN  HFA) 108 (90 Base) MCG/ACT inhaler Inhale 1-2 puffs into the lungs every 6 (six) hours as needed for wheezing or shortness of breath. 18 g 0   amLODipine  (NORVASC ) 5 MG tablet Take 1 tablet (5 mg total) by mouth daily. 90 tablet 0   budesonide -formoterol  (SYMBICORT ) 160-4.5 MCG/ACT inhaler Inhale 2 puffs into the lungs in the morning and at bedtime. 30.6 g 11   Dupilumab  (DUPIXENT ) 300 MG/2ML SOAJ Inject 300 mg into the skin every 14 (fourteen) days. 4 mL 4   ferrous sulfate  324 MG TBEC Take 1 tablet (324 mg total) by mouth daily. (Patient not taking: Reported on 08/25/2024)     fluticasone  (FLONASE ) 50 MCG/ACT nasal spray Place 2 sprays into both nostrils daily. 16 g 5   ipratropium-albuterol  (DUONEB) 0.5-2.5 (3) MG/3ML SOLN Take 3 mLs by nebulization every 6 (six) hours as needed. 360 mL 3   montelukast  (SINGULAIR ) 10 MG tablet Take 1 tablet (10 mg total) by mouth at bedtime. 30 tablet 11   Multiple Vitamin (MULTIVITAMIN) tablet Take 1 tablet by mouth daily.     semaglutide -weight management (WEGOVY ) 0.5 MG/0.5ML SOAJ SQ injection Inject 0.5 mg into the skin once a week. 2 mL 2   No facility-administered medications prior to visit.    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    Appearance: She is obese. She is not ill-appearing, toxic-appearing or diaphoretic.  HENT:     Head: Normocephalic and atraumatic.     Right Ear: Tympanic membrane, ear canal and external ear normal.     Left Ear: Tympanic membrane, ear canal and external ear normal.      Nose: Nose normal. No congestion.     Comments: L nasal poly    Mouth/Throat:     Mouth: Mucous membranes are moist.     Pharynx: Oropharynx is clear.  Eyes:     Extraocular Movements: Extraocular movements intact.     Right eye: Normal extraocular motion.     Left eye: Normal extraocular motion.     Conjunctiva/sclera: Conjunctivae normal.     Pupils: Pupils are equal, round, and reactive to light.  Neck:     Thyroid : No thyroid  mass or thyromegaly.     Vascular: No carotid bruit or  JVD.  Cardiovascular:     Rate and Rhythm: Normal rate and regular rhythm.     Pulses: Normal pulses.          Radial pulses are 2+ on the right side and 2+ on the left side.       Dorsalis pedis pulses are 2+ on the right side and 2+ on the left side.     Heart sounds: Normal heart sounds, S1 normal and S2 normal. No murmur heard.    No friction rub. No gallop.  Pulmonary:     Effort: Pulmonary effort is normal. No respiratory distress.     Breath sounds: Normal breath sounds.  Abdominal:     General: Bowel sounds are normal. There is no distension.     Palpations: Abdomen is soft.     Tenderness: There is no abdominal tenderness. There is no guarding.  Musculoskeletal:        General: Normal range of motion.     Cervical back: Full passive range of motion without pain and normal range of motion. No edema or erythema.     Right lower leg: No edema.     Left lower leg: No edema.  Lymphadenopathy:     Cervical: No cervical adenopathy.  Skin:    General: Skin is warm and dry.     Capillary Refill: Capillary refill takes less than 2 seconds.  Neurological:     General: No focal deficit present.     Mental Status: She is alert and oriented to person, place, and time.     Cranial Nerves: No cranial nerve deficit.     Motor: No weakness.     Coordination: Coordination normal.     Gait: Gait normal.     Deep Tendon Reflexes: Reflexes normal.  Psychiatric:        Mood and Affect: Mood normal.         Behavior: Behavior normal.        Thought Content: Thought content normal.      There were no vitals taken for this visit. Wt Readings from Last 3 Encounters:  08/25/24 285 lb (129.3 kg)  06/07/24 296 lb (134.3 kg)  06/03/24 294 lb 6.4 oz (133.5 kg)    Assessment & Plan:   Problem List Items Addressed This Visit     Obesity, morbid, BMI 40.0-49.9 (HCC)   Encouraged DASH or MIND diet, decrease po intake and increase exercise as tolerated.  Avoid trans fats, eat small, frequent meals every 4-5 hours with lean proteins, complex carbs and healthy fats. Minimize simple carbs, high fat foods and processed foods.       Primary hypertension - Primary   Well controlled, no changes to meds. Encouraged heart healthy diet such as the DASH diet and exercise as tolerated.        Seasonal allergies   Recently started on Singular. Denies adverse Ses. Continue Signular and flonase   If symptoms persist or worsen, consider referral to allergist      Severe persistent asthma without complication (HCC)   Follows with Pulmonology On Symbicourt BID, albuterol  PRN Denies recent exacerbations       Portions of this note were dictated using DRAGON voice recognition software. Please disregard any errors in transcription.      FU 3 months  I am having Fatma A. Yapp maintain her Wegovy , ferrous sulfate , multivitamin, amLODipine , ipratropium-albuterol , albuterol , Symbicort , fluticasone , montelukast , and Dupixent .  No orders of the defined types were placed in this encounter.

## 2024-09-03 ENCOUNTER — Ambulatory Visit: Admitting: Student

## 2024-09-03 ENCOUNTER — Encounter: Payer: Self-pay | Admitting: Student

## 2024-09-03 DIAGNOSIS — J302 Other seasonal allergic rhinitis: Secondary | ICD-10-CM

## 2024-09-03 DIAGNOSIS — J4550 Severe persistent asthma, uncomplicated: Secondary | ICD-10-CM

## 2024-09-03 DIAGNOSIS — I10 Essential (primary) hypertension: Secondary | ICD-10-CM

## 2024-09-03 DIAGNOSIS — Z91199 Patient's noncompliance with other medical treatment and regimen due to unspecified reason: Secondary | ICD-10-CM

## 2024-09-06 ENCOUNTER — Other Ambulatory Visit (HOSPITAL_COMMUNITY): Payer: Self-pay

## 2024-09-06 ENCOUNTER — Other Ambulatory Visit: Payer: Self-pay

## 2024-09-06 NOTE — Progress Notes (Unsigned)
 Subjective:     Patient ID: Brandy [name], female    DOB: [birth year], 28 y.o.   MRN: [id number]  No chief complaint on file.   HPI   Brandy [name] a 28 y.o.  female presents today for follow up.  Follows with Pulmonology  HTN Amlodipine  5 mg daily Home BP: 130s SBP/ 60-70s DBP Reports no adverse Ses  Hx Asthma Albuterol  PRN, budesonide - fomoterol (SYMBICORT )- 2 puffs daily, uncontrolled Using albuterol  daily 2-3 times, Pulmicort  nebulizer Reports taking medications as prescribed, recently obtained new Pulmicort  nebulizer  Reports no recent exacerbations- scheduled for Pulmonology visit  Allergy Singulair , Flonase   Reports taking medications as prescribed      Patient denies fever, chills, SOB, CP, palpitations, dyspnea, edema, HA, vision changes, N/V/D, abdominal pain, urinary symptoms, rash, and recent illness or hospitalizations.    History of Present Illness              Health Maintenance Due  Topic Date Due   Pneumococcal Vaccine (1 of 2 - PCV) Never done   Cervical Cancer Screening (Pap smear)  Never done   HPV VACCINES (2 - Risk 3-dose series) 09/03/2018   DTaP/Tdap/Td (7 - Td or Tdap) 03/14/2019   Influenza Vaccine  04/30/2024   COVID-19 Vaccine (1 - 2025-26 season) Never done    Past Medical History:  Diagnosis Date   ADHD (attention deficit hyperactivity disorder)    Allergy    Asthma    Chlamydia 07/15/2017   Depression    GERD (gastroesophageal reflux disease)    Hypertension    My blood pressure has been high the last few months   Trichomonal vaginitis 07/15/2017    Past Surgical History:  Procedure Laterality Date   CHOLECYSTECTOMY N/A 11/17/2015   Procedure: LAPAROSCOPIC CHOLECYSTECTOMY ATTEMPTED INTRAOPERATIVE CHOLANGIOGRAM ;  Surgeon: Debby Shipper, MD;  Location: [facility] OR;  Service: General;  Laterality: N/A;    Family History  Problem Relation Age of Onset   Asthma Mother    Depression Mother    Asthma Sister    ADD /  ADHD Sister    Diabetes Maternal Grandfather    Heart disease Maternal Grandfather     Social History   Socioeconomic History   Marital status: Single    Spouse name: Not on file   Number of children: 0   Years of education: Not on file   Highest education level: Associate degree: academic program  Occupational History   Not on file  Tobacco Use   Smoking status: Never   Smokeless tobacco: Never  Vaping Use   Vaping status: Never Used  Substance and Sexual Activity   Alcohol use: Yes   Drug use: Never   Sexual activity: Yes    Birth control/protection: None  Other Topics Concern   Not on file  Social History Narrative   Not on file   Social Drivers of Health   Financial Resource Strain: Low Risk  (05/05/2024)   Overall Financial Resource Strain (CARDIA)    Difficulty of Paying Living Expenses: Not very hard  Food Insecurity: Food Insecurity Present (05/05/2024)   Hunger Vital Sign    Worried About Running Out of Food in the Last Year: Sometimes true    Ran Out of Food in the Last Year: Sometimes true  Transportation Needs: No Transportation Needs (05/05/2024)   PRAPARE - Administrator, Civil Service (Medical): No    Lack of Transportation (Non-Medical): No  Physical Activity: Insufficiently Active (05/05/2024)  Exercise Vital Sign    Days of Exercise per Week: 2 days    Minutes of Exercise per Session: 30 min  Stress: Stress Concern Present (05/05/2024)   [employer] of Occupational Health - Occupational Stress Questionnaire    Feeling of Stress: To some extent  Social Connections: Unknown (05/05/2024)   Social Connection and Isolation Panel    Frequency of Communication with Friends and Family: More than three times a week    Frequency of Social Gatherings with Friends and Family: Twice a week    Attends Religious Services: 1 to 4 times per year    Active Member of [employer] or Organizations: No    Attends Engineer, Structural: Not on file     Marital Status: Patient declined  Catering Manager Violence: Not on file    Outpatient Medications Prior to Visit  Medication Sig Dispense Refill   albuterol  (VENTOLIN  HFA) 108 (90 Base) MCG/ACT inhaler Inhale 1-2 puffs into the lungs every 6 (six) hours as needed for wheezing or shortness of breath. 18 g 0   amLODipine  (NORVASC ) 5 MG tablet Take 1 tablet (5 mg total) by mouth daily. 90 tablet 0   budesonide -formoterol  (SYMBICORT ) 160-4.5 MCG/ACT inhaler Inhale 2 puffs into the lungs in the morning and at bedtime. 30.6 g 11   Dupilumab  (DUPIXENT ) 300 MG/2ML SOAJ Inject 300 mg into the skin every 14 (fourteen) days. 4 mL 4   ferrous sulfate  324 MG TBEC Take 1 tablet (324 mg total) by mouth daily. (Patient not taking: Reported on 08/25/2024)     fluticasone  (FLONASE ) 50 MCG/ACT nasal spray Place 2 sprays into both nostrils daily. 16 g 5   ipratropium-albuterol  (DUONEB) 0.5-2.5 (3) MG/3ML SOLN Take 3 mLs by nebulization every 6 (six) hours as needed. 360 mL 3   montelukast  (SINGULAIR ) 10 MG tablet Take 1 tablet (10 mg total) by mouth at bedtime. 30 tablet 11   Multiple Vitamin (MULTIVITAMIN) tablet Take 1 tablet by mouth daily.     semaglutide -weight management (WEGOVY ) 0.5 MG/0.5ML SOAJ SQ injection Inject 0.5 mg into the skin once a week. 2 mL 2   No facility-administered medications prior to visit.    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    Appearance: She is obese. She is not ill-appearing, toxic-appearing or diaphoretic.  HENT:     Head: Normocephalic and atraumatic.     Right Ear: Tympanic membrane, ear canal and external ear normal.     Left Ear: Tympanic membrane, ear canal and external ear normal.     Nose: Nose normal. No congestion.     Comments: L nasal poly     Mouth/Throat:     Mouth: Mucous membranes are moist.     Pharynx: Oropharynx is clear.  Eyes:     Extraocular Movements: Extraocular movements intact.     Right eye: Normal extraocular motion.     Left eye: Normal extraocular motion.     Conjunctiva/sclera: Conjunctivae normal.     Pupils: Pupils are equal, round, and reactive to light.  Neck:     Thyroid : No thyroid  mass or thyromegaly.     Vascular: No carotid bruit or JVD.  Cardiovascular:     Rate and Rhythm: Normal  rate and regular rhythm.     Pulses: Normal pulses.          Radial pulses are 2+ on the right side and 2+ on the left side.       Dorsalis pedis pulses are 2+ on the right side and 2+ on the left side.     Heart sounds: Normal heart sounds, S1 normal and S2 normal. No murmur heard.    No friction rub. No gallop.  Pulmonary:     Effort: Pulmonary effort is normal. No respiratory distress.     Breath sounds: Normal breath sounds.  Abdominal:     General: Bowel sounds are normal. There is no distension.     Palpations: Abdomen is soft.     Tenderness: There is no abdominal tenderness. There is no guarding.  Musculoskeletal:        General: Normal range of motion.     Cervical back: Full passive range of motion without pain and normal range of motion. No edema or erythema.     Right lower leg: No edema.     Left lower leg: No edema.  Lymphadenopathy:     Cervical: No cervical adenopathy.  Skin:    General: Skin is warm and dry.     Capillary Refill: Capillary refill takes less than 2 seconds.  Neurological:     General: No focal deficit present.     Mental Status: She is alert and oriented to person, place, and time.     Cranial Nerves: No cranial nerve deficit.     Motor: No weakness.     Coordination: Coordination normal.     Gait: Gait normal.     Deep Tendon Reflexes: Reflexes normal.  Psychiatric:        Mood and Affect: Mood normal.        Behavior: Behavior normal.        Thought Content: Thought  content normal.      There were no vitals taken for this visit. Wt Readings from Last 3 Encounters:  08/25/24 285 lb (129.3 kg)  06/07/24 296 lb (134.3 kg)  06/03/24 294 lb 6.4 oz (133.5 kg)    Assessment & Plan:   Problem List Items Addressed This Visit   None   Portions of this note were dictated using [employer] voice recognition software. Please disregard any errors in transcription.      FU 3 months  I am having Brandy [name] maintain her Wegovy , ferrous sulfate , multivitamin, amLODipine , ipratropium-albuterol , albuterol , Symbicort , fluticasone , montelukast , and Dupixent .  No orders of the defined types were placed in this encounter.

## 2024-09-06 NOTE — Progress Notes (Signed)
 Specialty Pharmacy Refill Coordination Note  MyChart Questionnaire Submission  Brandy [name] is a 28 y.o. female contacted today regarding refills of specialty medication(s) Dupixent .  Doses on hand: (Patient-Rptd) 0   Injection date: (Patient-Rptd) 09/16/24  Patient requested: (Patient-Rptd) Delivery   Delivery date: 09/14/24  Verified address: [address] [place] [address]  Medication will be filled on 09/13/24

## 2024-09-08 ENCOUNTER — Ambulatory Visit: Admitting: Student

## 2024-09-08 DIAGNOSIS — Z91199 Patient's noncompliance with other medical treatment and regimen due to unspecified reason: Secondary | ICD-10-CM

## 2024-09-09 ENCOUNTER — Encounter: Payer: Self-pay | Admitting: Student

## 2024-09-13 ENCOUNTER — Other Ambulatory Visit: Payer: Self-pay

## 2024-09-14 ENCOUNTER — Telehealth: Payer: Self-pay

## 2024-09-14 DIAGNOSIS — M791 Myalgia, unspecified site: Secondary | ICD-10-CM

## 2024-09-14 DIAGNOSIS — J4550 Severe persistent asthma, uncomplicated: Secondary | ICD-10-CM

## 2024-09-14 NOTE — Telephone Encounter (Signed)
 Cbc/dc, ige ordered

## 2024-09-14 NOTE — Telephone Encounter (Signed)
 Called pt for more history, phone was not picked up. I needed to know how long she has been on dupixent  now. She started recently but how many doses approximately has she taken so far? Is the pain only on injection site? If so, would continue at a different site. Sometimes pt can have allergic response to the medication dupixent  itself Does she have rashes, fever? If so would advise stopping If she recently started taking the medication and has body aches mostly without other major symptoms, would advise continuing to see if the adverse effects improve with time. Can use tylenol , ibuprofen  2-3 times a day as needed. However if the symptoms are bothering her too much, we can always stop the medication.  I have ordered CBC/DC to rule out eosinophilia which can be a side effect of dupixent  sometimes.

## 2024-09-15 NOTE — Telephone Encounter (Signed)
 Okay, she can continue it for now. Can take 650 tylenol  every 8 hours as needed or ibuprofen  400 every 6-8 hours as needed for few days after injection. Please have her do the labs when she can. If has significant eosinophilia, we may need to stop and explore other options. Can set up a visit to review and discuss other options

## 2024-10-05 ENCOUNTER — Other Ambulatory Visit: Payer: Self-pay

## 2024-10-06 ENCOUNTER — Other Ambulatory Visit: Payer: Self-pay | Admitting: Pharmacy Technician

## 2024-10-06 ENCOUNTER — Other Ambulatory Visit: Payer: Self-pay

## 2024-10-06 NOTE — Progress Notes (Signed)
 Specialty Pharmacy Refill Coordination Note  Brandy [name] is a 29 y.o. female contacted today regarding refills of specialty medication(s) Dupilumab  (Dupixent )   Patient requested Delivery   Delivery date: 10/12/24   Verified address: (Patient-Rptd) [address] [place] [address]   Medication will be filled on: 10/11/24

## 2024-10-11 ENCOUNTER — Other Ambulatory Visit: Payer: Self-pay

## 2024-10-22 ENCOUNTER — Encounter: Payer: Self-pay | Admitting: Student

## 2024-10-22 DIAGNOSIS — J4550 Severe persistent asthma, uncomplicated: Secondary | ICD-10-CM

## 2024-10-22 MED ORDER — ALBUTEROL SULFATE HFA 108 (90 BASE) MCG/ACT IN AERS: 1.0000 | INHALATION_SPRAY | Freq: Four times a day (QID) | RESPIRATORY_TRACT | 0 refills | Status: AC | PRN

## 2024-10-22 MED ORDER — FLUTICASONE PROPIONATE 50 MCG/ACT NA SUSP: 2.0000 | Freq: Every day | NASAL | 5 refills | Status: AC

## 2024-10-29 ENCOUNTER — Other Ambulatory Visit: Payer: Self-pay

## 2024-10-29 NOTE — Addendum Note (Signed)
 Addended by: SHAREN DELON HERO on: 10/29/2024 12:49 PM   Modules accepted: Orders

## 2024-11-03 MED ORDER — BUDESONIDE-FORMOTEROL FUMARATE 160-4.5 MCG/ACT IN AERO: 2.0000 | INHALATION_SPRAY | Freq: Two times a day (BID) | RESPIRATORY_TRACT | 6 refills | Status: AC

## 2024-11-03 NOTE — Addendum Note (Signed)
 Addended by: MELVENIA WILFORD SAUNDERS on: 11/03/2024 09:59 AM   Modules accepted: Orders

## 2024-11-04 ENCOUNTER — Other Ambulatory Visit (HOSPITAL_COMMUNITY): Payer: Self-pay

## 2024-11-04 ENCOUNTER — Ambulatory Visit

## 2024-11-04 ENCOUNTER — Telehealth: Payer: Self-pay

## 2024-11-04 ENCOUNTER — Other Ambulatory Visit: Payer: Self-pay

## 2024-11-04 DIAGNOSIS — J4550 Severe persistent asthma, uncomplicated: Secondary | ICD-10-CM

## 2024-11-04 DIAGNOSIS — Z79899 Other long term (current) drug therapy: Secondary | ICD-10-CM

## 2024-11-04 MED ORDER — DUPIXENT 300 MG/2ML ~~LOC~~ SOAJ
300.0000 mg | SUBCUTANEOUS | 2 refills | Status: AC
  Filled 2024-11-04: qty 4, 28d supply, fill #0

## 2024-11-04 NOTE — Progress Notes (Signed)
 Specialty Pharmacy Refill Coordination Note  Brandy [name] is a 29 y.o. female contacted today regarding refills of specialty medication(s) Dupilumab  (Dupixent )   Patient requested Delivery   Delivery date: 11/09/24   Verified address: [address] [place] [address]   Medication will be filled on: 11/08/24

## 2024-11-04 NOTE — Telephone Encounter (Signed)
*  [facility]  Pharmacy Patient Advocate Encounter   Received notification from Fax that prior authorization for Budesonide -Formoterol  Fumarate 160-4.5MCG/ACT aerosol   is required/requested.   Insurance verification completed.   The patient is insured through [employer].   Per test claim: Refill too soon. PA is not needed at this time. Medication was filled 02/03. Next eligible fill date is 03/20.

## 2024-11-04 NOTE — Progress Notes (Signed)
 [facility] - Continuation of Therapy with Biologic  Referring Provider: Dipti Baral (verbal authorization received for referral)  Virtual Visit via Telephone Note  I connected with Byron A Bares on 11/04/24 at  4:40 PM EST by telephone and verified that I am speaking with the correct person using two identifiers.  Location: Patient: home Provider: office   I discussed the limitations, risks, security and privacy concerns of performing an evaluation and management service by telephone and the availability of in person appointments. I also discussed with the patient that there may be a patient responsible charge related to this service. The patient expressed understanding and agreed to proceed.  HPI: Brandy [name] is a 29 y.o. female who presents to the pharmacotherapy clinic via telephone for continuation of therapy with Dupixent . Last OV with Dr. Pleas was on 08/25/24.   Indication: asthma Dosing: 300mg  every 2 weeks  Patient's current respiratory regimen: Duoneb, Symbicort , Ventolin  PRN, Montelukast   Patient Active Problem List   Diagnosis Date Noted   Severe persistent asthma without complication (HCC) 09/02/2024   Annual visit for general adult medical examination without abnormal findings 05/31/2024   Hyperglycemia 05/31/2024   Anemia 05/31/2024   Anxiety and depression 05/31/2024   Uncontrolled asthma 05/05/2024   Primary hypertension 05/05/2024   Morbid obesity with BMI of 45.0-49.9, adult (HCC) 05/05/2024   Encounter to establish care 05/05/2024   Seasonal allergies 05/05/2024   Moderate persistent asthma with acute exacerbation 06/11/2021   Irregular menstrual bleeding 07/17/2017   Obesity, morbid, BMI 40.0-49.9 (HCC) 09/11/2016   Cholecystitis with cholelithiasis 11/16/2015   Constipation    Nausea    Abdominal pain, recurrent     Patient's Medications  New Prescriptions   No medications on file  Previous Medications   ALBUTEROL   (VENTOLIN  HFA) 108 (90 BASE) MCG/ACT INHALER    Inhale 1-2 puffs into the lungs every 6 (six) hours as needed for wheezing or shortness of breath.   AMLODIPINE  (NORVASC ) 5 MG TABLET    Take 1 tablet (5 mg total) by mouth daily.   BUDESONIDE -FORMOTEROL  (SYMBICORT ) 160-4.5 MCG/ACT INHALER    Inhale 2 puffs into the lungs in the morning and at bedtime.   BUDESONIDE -FORMOTEROL  (SYMBICORT ) 160-4.5 MCG/ACT INHALER    Inhale 2 puffs into the lungs in the morning and at bedtime.   FERROUS SULFATE  324 MG TBEC    Take 1 tablet (324 mg total) by mouth daily.   FLUTICASONE  (FLONASE ) 50 MCG/ACT NASAL SPRAY    Place 2 sprays into both nostrils daily.   IPRATROPIUM-ALBUTEROL  (DUONEB) 0.5-2.5 (3) MG/3ML SOLN    Take 3 mLs by nebulization every 6 (six) hours as needed.   MONTELUKAST  (SINGULAIR ) 10 MG TABLET    Take 1 tablet (10 mg total) by mouth at bedtime.   MULTIPLE VITAMIN (MULTIVITAMIN) TABLET    Take 1 tablet by mouth daily.   SEMAGLUTIDE -WEIGHT MANAGEMENT (WEGOVY ) 0.5 MG/0.5ML SOAJ SQ INJECTION    Inject 0.5 mg into the skin once a week.  Modified Medications   Modified Medication Previous Medication   DUPILUMAB  (DUPIXENT ) 300 MG/2ML SOAJ Dupilumab  (DUPIXENT ) 300 MG/2ML SOAJ      Inject 300 mg into the skin every 14 (fourteen) days.    Inject 300 mg into the skin every 14 (fourteen) days.  Discontinued Medications   No medications on file    Allergies: Allergies[1]  Past Medical History: Past Medical History:  Diagnosis Date   ADHD (attention deficit hyperactivity disorder)    Allergy  Asthma    Chlamydia 07/15/2017   Depression    GERD (gastroesophageal reflux disease)    Hypertension    My blood pressure has been high the last few months   Trichomonal vaginitis 07/15/2017    Social History: Social History   Socioeconomic History   Marital status: Single    Spouse name: Not on file   Number of children: 0   Years of education: Not on file   Highest education level: Associate  degree: academic program  Occupational History   Not on file  Tobacco Use   Smoking status: Never   Smokeless tobacco: Never  Vaping Use   Vaping status: Never Used  Substance and Sexual Activity   Alcohol use: Yes   Drug use: Never   Sexual activity: Yes    Birth control/protection: None  Other Topics Concern   Not on file  Social History Narrative   Not on file   Social Drivers of Health   Tobacco Use: Low Risk (09/09/2024)   Patient History    Smoking Tobacco Use: Never    Smokeless Tobacco Use: Never    Passive Exposure: Not on file  Financial Resource Strain: Low Risk (05/05/2024)   Overall Financial Resource Strain (CARDIA)    Difficulty of Paying Living Expenses: Not very hard  Food Insecurity: Food Insecurity Present (05/05/2024)   Epic    Worried About Programme Researcher, Broadcasting/film/video in the Last Year: Sometimes true    Ran Out of Food in the Last Year: Sometimes true  Transportation Needs: No Transportation Needs (05/05/2024)   Epic    Lack of Transportation (Medical): No    Lack of Transportation (Non-Medical): No  Physical Activity: Insufficiently Active (05/05/2024)   Exercise Vital Sign    Days of Exercise per Week: 2 days    Minutes of Exercise per Session: 30 min  Stress: Stress Concern Present (05/05/2024)   [employer] of Occupational Health - Occupational Stress Questionnaire    Feeling of Stress: To some extent  Social Connections: Unknown (05/05/2024)   Social Connection and Isolation Panel    Frequency of Communication with Friends and Family: More than three times a week    Frequency of Social Gatherings with Friends and Family: Twice a week    Attends Religious Services: 1 to 4 times per year    Active Member of [employer] or Organizations: No    Attends Banker Meetings: Not on file    Marital Status: Patient declined  Depression (PHQ2-9): Low Risk (06/03/2024)   Depression (PHQ2-9)    PHQ-2 Score: 4  Recent Concern: Depression (PHQ2-9) - Medium Risk  (05/05/2024)   Depression (PHQ2-9)    PHQ-2 Score: 5  Alcohol Screen: Low Risk (05/05/2024)   Alcohol Screen    Last Alcohol Screening Score (AUDIT): 2  Housing: Low Risk (05/05/2024)   Epic    Unable to Pay for Housing in the Last Year: No    Number of Times Moved in the Last Year: 0    Homeless in the Last Year: No  Utilities: Not on file  Health Literacy: Not on file      Assessment/Plan: 1. Patient prescribed Dupixent  for asthma. Reviewed the medication with the patient, including the following:   Goals of therapy: Mechanism: monoclonal antibody used for the treatment of asthma or asthma/COPD overlap Reviewed that Dupixent  is add-on medication and patient must continue maintenance inhaler regimen. Response to therapy: may take 3-4 months to determine efficacy.  Side effects: injection  site reaction, antibody development, arthralgia, ocular effects  Dose: Dupixent  300mg  once every 2 weeks  Administration/Storage:  Administer as a SubQ injection and rotate sites.  Keep medication in refrigerator, do not freeze.  Allow the medication to reach room temp prior to administration (45 mins for 300 mg syringe or 30 min for 200 mg syringe). Do not shake.   Plan:  - Patient will continue injections every 2 weeks at home.  - Rx will be triaged to [facility] for delivery to patient home.   I discussed the assessment and treatment plan with the patient. The patient was provided an opportunity to ask questions and all were answered. The patient agreed with the plan and demonstrated an understanding of the instructions.   The patient was advised to call back or seek an in-person evaluation if the symptoms worsen or if the condition fails to improve as anticipated.  I provided 10 minutes of non-face-to-face time during this encounter.  Patient verbalizes understanding and agreement with plan.   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

## 2024-11-05 ENCOUNTER — Other Ambulatory Visit: Payer: Self-pay

## 2024-12-13 ENCOUNTER — Ambulatory Visit

## 2024-12-13 ENCOUNTER — Encounter
